# Patient Record
Sex: Female | Born: 1935 | Race: White | Hispanic: No | Smoking: Never smoker
Health system: Southern US, Community
[De-identification: ages and names within clinical notes are randomized; demographics above are authoritative.]

## PROBLEM LIST (undated history)

## (undated) DIAGNOSIS — I10 Essential (primary) hypertension: Secondary | ICD-10-CM

## (undated) DIAGNOSIS — E782 Mixed hyperlipidemia: Secondary | ICD-10-CM

## (undated) HISTORY — DX: Mixed hyperlipidemia: E78.2

## (undated) HISTORY — DX: Essential (primary) hypertension: I10

---

## 2006-08-23 ENCOUNTER — Other Ambulatory Visit: Payer: Self-pay

## 2006-08-24 ENCOUNTER — Inpatient Hospital Stay: Payer: Self-pay | Admitting: Cardiology

## 2006-08-25 ENCOUNTER — Inpatient Hospital Stay (HOSPITAL_COMMUNITY): Admission: AD | Admit: 2006-08-25 | Discharge: 2006-09-06 | Payer: Self-pay | Admitting: Cardiology

## 2006-08-25 ENCOUNTER — Encounter: Payer: Self-pay | Admitting: Internal Medicine

## 2006-08-25 ENCOUNTER — Ambulatory Visit: Payer: Self-pay | Admitting: Internal Medicine

## 2006-09-07 ENCOUNTER — Ambulatory Visit: Payer: Self-pay | Admitting: Internal Medicine

## 2006-09-20 ENCOUNTER — Ambulatory Visit: Payer: Self-pay | Admitting: Cardiology

## 2006-10-12 ENCOUNTER — Ambulatory Visit: Payer: Self-pay | Admitting: Cardiology

## 2006-11-09 ENCOUNTER — Encounter: Payer: Self-pay | Admitting: Cardiology

## 2006-11-09 ENCOUNTER — Ambulatory Visit: Payer: Self-pay | Admitting: Cardiology

## 2006-11-17 ENCOUNTER — Ambulatory Visit: Payer: Self-pay | Admitting: Cardiology

## 2007-01-05 ENCOUNTER — Ambulatory Visit: Payer: Self-pay | Admitting: Cardiology

## 2007-02-09 ENCOUNTER — Ambulatory Visit: Payer: Self-pay | Admitting: Cardiology

## 2007-03-02 ENCOUNTER — Ambulatory Visit: Payer: Self-pay | Admitting: Cardiology

## 2007-08-31 ENCOUNTER — Ambulatory Visit: Payer: Self-pay | Admitting: Cardiology

## 2007-12-07 ENCOUNTER — Ambulatory Visit: Payer: Self-pay | Admitting: Cardiology

## 2008-07-24 ENCOUNTER — Ambulatory Visit: Payer: Self-pay | Admitting: Cardiology

## 2009-04-04 DIAGNOSIS — I10 Essential (primary) hypertension: Secondary | ICD-10-CM | POA: Insufficient documentation

## 2009-04-04 DIAGNOSIS — E785 Hyperlipidemia, unspecified: Secondary | ICD-10-CM | POA: Insufficient documentation

## 2009-05-07 ENCOUNTER — Ambulatory Visit: Payer: Self-pay | Admitting: Cardiology

## 2009-05-07 DIAGNOSIS — I10 Essential (primary) hypertension: Secondary | ICD-10-CM | POA: Insufficient documentation

## 2009-05-07 DIAGNOSIS — I2510 Atherosclerotic heart disease of native coronary artery without angina pectoris: Secondary | ICD-10-CM | POA: Insufficient documentation

## 2009-11-20 ENCOUNTER — Ambulatory Visit: Payer: Self-pay | Admitting: Cardiovascular Disease

## 2009-11-21 ENCOUNTER — Telehealth: Payer: Self-pay | Admitting: Cardiovascular Disease

## 2009-12-13 ENCOUNTER — Telehealth: Payer: Self-pay | Admitting: Cardiovascular Disease

## 2009-12-20 ENCOUNTER — Telehealth: Payer: Self-pay | Admitting: Cardiovascular Disease

## 2010-01-01 ENCOUNTER — Telehealth: Payer: Self-pay | Admitting: Internal Medicine

## 2010-01-02 ENCOUNTER — Encounter: Payer: Self-pay | Admitting: Cardiovascular Disease

## 2010-04-28 ENCOUNTER — Telehealth: Payer: Self-pay | Admitting: Nurse Practitioner

## 2010-06-18 ENCOUNTER — Ambulatory Visit
Admission: RE | Admit: 2010-06-18 | Discharge: 2010-06-18 | Payer: Self-pay | Source: Home / Self Care | Attending: Cardiovascular Disease | Admitting: Cardiovascular Disease

## 2010-06-18 ENCOUNTER — Encounter: Payer: Self-pay | Admitting: Cardiovascular Disease

## 2010-07-08 NOTE — Progress Notes (Signed)
Summary: Cardiology Phone Note - Fatigue & Irregular Heart Rhythm  Phone Note From Other Clinic   Caller: Nurse Summary of Call: received call from [facility], nurse @ [facility] stating that [name] had a 'gi bug' this weekend with diarrhea.  since, she's complaining of wkns and fatigue.  nurse noted tonight that her heart rhythm is irregular in the 80's @ rest and into the low 100's with any activity.  further, her bp is 180's/60's.  she's asking if she should take her to the ED.  I advised that given that she's feeling poorly with new finding of tachycardia and htn on exam, that she should present to [facility] for evaluation this evening. Initial call taken by: Creig Hines, ANP-BC,  April 28, 2010 5:12 PM

## 2010-07-08 NOTE — Assessment & Plan Note (Signed)
Summary: EC6/AMD   Visit Type:  Follow-up Primary Provider:  Dr. Felipa Eth  CC:  edema in ankles.  History of Present Illness: Mrs [name] he is a 77 year old woman with a history of PE, though the details are uncertain, marked reduction in left ventricular function with apical ballooning suggestive of takotsubo, hyperlipidemia, hypertension presenting for routine followup.  She states that overall she has been doing well. she has no new complaints. 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 She walks to meals and that is about it. Her legs are not very strong. She denies any significant chest pain, shortness of breath.   Cardiac Cath  08/25/2006  1. Marked reduction in left ventricular function with apical      ballooning suggestive of takotsubo.  2. Noncritical plaquing of the coronary arteries as described above.   Current Medications (verified): 1)  Metformin Hcl 500 Mg Tabs (Metformin Hcl) .Marland Kitchen.. 1 By Mouth Every Morning 2)  Aspirin 325 Mg Tabs (Aspirin) .Marland Kitchen.. 1 By Mouth Every Morning 3)  Multivitamins  Tabs (Multiple Vitamin) .Marland Kitchen.. 1 By Mouth Every Morning 4)  Oscal 500/200 D-3 500-200 Mg-Unit Tabs (Calcium-Vitamin D) .Marland Kitchen.. 1 By Mouth Every Morning 5)  Nexium 40 Mg Cpdr (Esomeprazole Magnesium) .Marland Kitchen.. 1 By Mouth Every Morning 6)  Lasix 40 Mg Tabs (Furosemide) .Marland Kitchen.. 1 By Mouth Every Morning 7)  K-Lor 20 Meq Pack (Potassium Chloride) .Marland Kitchen.. 1 By Mouth Every Morning 8)  Norvasc 5 Mg Tabs (Amlodipine Besylate) .Marland Kitchen.. 1 By Mouth Every Morning 9)  Enalapril Maleate 20 Mg Tabs (Enalapril Maleate) .Marland Kitchen.. 1 By Mouth Twice Daily 10)  Simvastatin 40 Mg Tabs (Simvastatin) .Marland Kitchen.. 1 By Mouth Once Every Evening 11)  Xalatan 0.005 % Soln (Latanoprost) .Marland Kitchen.. 1 Drop To Each Eye At Bedtime 12)  Azopt 1 % Susp (Brinzolamide) .Marland Kitchen.. 1 Drop To Each Eye Every Morning and Every Evening 13)  Premarin 0.625 Mg/gm Crea (Estrogens, Conjugated) .... Use According To Directions On Monday, Wednesday and Friday 14)  Voltaren  1 % Gel (Diclofenac Sodium) .... Apply To Painful Areas As Needed 15)  Tylenol Arthritis Pain 650 Mg Cr-Tabs (Acetaminophen) .Marland Kitchen.. 1-2 Tablets As Needed For Knee Pain 16)  Alprazolam 0.25 Mg Tabs (Alprazolam) .Marland Kitchen.. 1 Tablet As Needed For Elevated Blood Pressure and Anxiety 17)  Carvedilol 6.25 Mg Tabs (Carvedilol) .Marland Kitchen.. 1 & 1/2 By Mouth Twice Daily 18)  Alprazolam 0.25 Mg Tabs (Alprazolam) .... As Needed 19)  Nitrofurantoin Macrocrystal 50 Mg Caps (Nitrofurantoin Macrocrystal) .... Once Daily  Allergies (verified): No Known Drug Allergies  Past History:  Past Medical History: Last updated: 04/04/2009 HYPERTENSION, UNSPECIFIED (ICD-401.9) HYPERLIPIDEMIA-MIXED (ICD-272.4)    Family History: Last updated: 04/04/2009 Family History of Hypertension:   Social History: Last updated: 04/04/2009 Retired  Widowed  Tobacco Use - No.  Alcohol Use - no  Risk Factors: Smoking Status: never (04/04/2009)  Review of Systems       The patient complains of difficulty walking.  The patient denies fever, weight loss, weight gain, vision loss, decreased hearing, hoarseness, chest pain, syncope, dyspnea on exertion, peripheral edema, prolonged cough, abdominal pain, incontinence, muscle weakness, depression, and enlarged lymph nodes.    Vital Signs:  Patient profile:   77 year old female Height:      64 inches Weight:      141 pounds BMI:     24.29 Pulse rate:   68 / minute BP sitting:   160 / 60  (left arm) Cuff size:   regular  Vitals  Entered By: Bishop Dublin, CMA (November 20, 2009 11:22 AM)  Physical Exam  General:  Well developed, well nourished, in no acute distress. Head:  normocephalic and atraumatic Neck:  Neck supple, no JVD. No masses, thyromegaly or abnormal cervical nodes. Lungs:  Clear bilaterally to auscultation and percussion. Heart:  Non-displaced PMI, chest non-tender; regular rate and rhythm, S1, S2 without murmurs, rubs or gallops. Carotid upstroke normal, no bruit.  Pedals normal pulses. No edema, no varicosities. Abdomen:  Bowel sounds positive; abdomen soft and non-tender without masses Msk:  Back normal, normal gait. Muscle strength and tone normal. Pulses:  pulses normal in all 4 extremities Extremities:  No clubbing or cyanosis. Neurologic:  Alert and oriented x 3. Skin:  Intact without lesions or rashes. Psych:  Normal affect.   New Orders:     1)  EKG w/ Interpretation (93000)   EKG  Procedure date:  11/20/2009  Findings:      normal sinus rhythm with rate 68 beats per minute, T-wave abnormality in V4, V5, V6.  Impression & Recommendations:  Problem # 1:  CAD, NATIVE VESSEL (ICD-414.01) h/o Marked reduction in left ventricular function with apical ballooning suggestive of takotsubo with minimal coronary artery disease. She's been managed on medications including ACE inhibitor, beta blocker.  I have encouraged her to increase her walking not only to just meals.  Her updated medication list for this problem includes:    Aspirin 325 Mg Tabs (Aspirin) .Marland Kitchen... 1 by mouth every morning    Norvasc 5 Mg Tabs (Amlodipine besylate) .Marland Kitchen... 1 by mouth every morning    Enalapril Maleate 20 Mg Tabs (Enalapril maleate) .Marland Kitchen... 1 by mouth twice daily    Carvedilol 6.25 Mg Tabs (Carvedilol) .Marland Kitchen... 1 & 1/2 by mouth twice daily    Amlodipine Besylate 10 Mg Tabs (Amlodipine besylate) .Marland Kitchen... Take one tablet by mouth daily  Orders: EKG w/ Interpretation (93000)  Problem # 2:  HYPERTENSION, BENIGN (ICD-401.1) Her blood pressure is elevated today and we will increase her amlodipine to 10 mg daily.  Her updated medication list for this problem includes:    Aspirin 325 Mg Tabs (Aspirin) .Marland Kitchen... 1 by mouth every morning    Lasix 40 Mg Tabs (Furosemide) .Marland Kitchen... 1 by mouth every morning    Norvasc 5 Mg Tabs (Amlodipine besylate) .Marland Kitchen... 1 by mouth every morning    Enalapril Maleate 20 Mg Tabs (Enalapril maleate) .Marland Kitchen... 1 by mouth twice daily    Carvedilol 6.25 Mg Tabs  (Carvedilol) .Marland Kitchen... 1 & 1/2 by mouth twice daily    Amlodipine Besylate 10 Mg Tabs (Amlodipine besylate) .Marland Kitchen... Take one tablet by mouth daily  Problem # 3:  HYPERLIPIDEMIA-MIXED (ICD-272.4) We will decrease her simvastatin from 40 mg to 20 mg daily given any restrictions. We have suggested we recheck her cholesterol in 2-3 months time.  Her updated medication list for this problem includes:    Simvastatin 20 Mg Tabs (Simvastatin) .Marland Kitchen... Take one tablet by mouth daily at bedtime  Patient Instructions: 1)  Your physician has recommended you make the following change in your medication: Increase amolodipine 10 mg dialy, Decrease simvastatin 20 mg  2)  Your physician wants you to follow-up in:   6 months You will receive a reminder letter in the mail two months in advance. If you don't receive a letter, please call our office to schedule the follow-up appointment. Prescriptions: SIMVASTATIN 20 MG TABS (SIMVASTATIN) Take one tablet by mouth daily at bedtime  #90 x 4   Entered  by:   Benedict Needy, RN   Authorized by:   Dossie Arbour MD   Signed by:   Benedict Needy, RN on 11/20/2009   Method used:   Faxed to ...       [facility] [employer] (mail-order)       [address]       [place], [place]  [address]       Ph: [phone]       Fax: [phone]   RxID:   [id number] AMLODIPINE BESYLATE 10 MG TABS (AMLODIPINE BESYLATE) Take one tablet by mouth daily  #90 x 4   Entered by:   Benedict Needy, RN   Authorized by:   Dossie Arbour MD   Signed by:   Benedict Needy, RN on 11/20/2009   Method used:   Faxed to ...       [facility] (mail-order)       [address]       [place], [place]  [address]       Ph: [phone]       Fax: [phone]   RxID:   [id number]

## 2010-07-08 NOTE — Progress Notes (Signed)
Summary: labwork order  Phone Note Call from Patient Call back at [phone]   Caller: [facility] Call For: Mariah Milling Summary of Call: Caller states pt was told at [facility] yesterday needs lipid/liver labwork done in 1 month.  [facility] needs written order faxed to [phone] to draw there and will fax results.  No documentation in EMR to repeat labwork.  Please advise if you want to draw lipid/liver labwork in 1 month.   Initial call taken by: Cloyde Reams RN,  November 21, 2009 9:43 AM  Follow-up for Phone Call        OK to draw at [facility] in one month.     Appended Document: Orders Update    Clinical Lists Changes  Orders: Added new Test order of T-Hepatic Function [id number]) - Signed Added new Test order of T-Lipid Profile ([id number]) - Signed      Appended Document: labwork order Faxed written order to [facility] for lipid/liver in 1 month with results faxed to [place].  [facility]

## 2010-07-08 NOTE — Progress Notes (Signed)
Summary: refill request for metformin  Phone Note Refill Request Message from:  Fax from Pharmacy  Refills Requested: Medication #1:  METFORMIN HCL 500 MG TABS 1 by mouth every morning   Last Refilled: 11/27/2009 Faxed request from pharmacare is on your desk.  I dont see that pt has had any visits with you.  Initial call taken by: Lowella Petties CMA,  January 01, 2010 4:25 PM  Follow-up for Phone Call        Please check with pharmacare she is not in health care at [facility] and I haven't seen her at least in the past 3+ years Who has given her past Rx? Avigail Pilling Dia Crawford MD  January 01, 2010 7:47 PM   Additional Follow-up for Phone Call Additional follow up Details #1::        Called pharmacare, they will take care of this. Additional Follow-up by: Lowella Petties CMA,  January 02, 2010 9:44 AM

## 2010-07-08 NOTE — Progress Notes (Signed)
Summary: Med Change  Phone Note From Other Clinic   Caller: Nurse Call For: [facility] Summary of Call: [phone] Paula Compton from [facility] called and wanted to know if increasin the Norvasc for patient could cause leg swelling.  Initial call taken by: West Carbo,  December 13, 2009 8:53 AM  Follow-up for Phone Call        [facility] Benedict Needy, RN  December 13, 2009 9:11 AM   Spoke with Paula Compton she said the edema isn't pitting but the pt is uncomfortable. Please advise. Follow-up by: Benedict Needy, RN,  December 13, 2009 9:44 AM  Additional Follow-up for Phone Call Additional follow up Details #1::        faxed orders for amolodipine 5mg  and BP checks x1 week.  Additional Follow-up by: Benedict Needy, RN,  December 13, 2009 5:31 PM    Additional Follow-up for Phone Call Additional follow up Details #2::    will decrease amlodipine to 5 mg and monitor bp  New/Updated Medications: AMLODIPINE BESYLATE 5 MG TABS (AMLODIPINE BESYLATE) Take one tablet by mouth daily Prescriptions: AMLODIPINE BESYLATE 5 MG TABS (AMLODIPINE BESYLATE) Take one tablet by mouth daily  #30 x 6   Entered by:   Benedict Needy, RN   Authorized by:   Dossie Arbour MD   Signed by:   Benedict Needy, RN on 12/13/2009   Method used:   Print then Give to Patient   RxID:   [id number]

## 2010-07-08 NOTE — Progress Notes (Signed)
Summary: BP Week Reading  Phone Note From Other Clinic   Caller: Nurse Call For: [place] Summary of Call: [name] from [facility] called with [name] BP: Monday 130/60, Tues 122/60  [id number]  [id number] friday12/068   If you need to get in touch with [name] her number is [phone] Initial call taken by: West Carbo,  December 20, 2009 2:33 PM  Follow-up for Phone Call        Noted. I will forward to Dr. Mariah Milling for review. Follow-up by: Sherri Rad, RN, BSN,  December 20, 2009 5:08 PM    Additional Follow-up for Phone Call Additional follow up Details #2::    BP looks great

## 2010-07-10 NOTE — Assessment & Plan Note (Signed)
Summary: F6M/AMD   Visit Type:  Follow-up Primary Provider:  Dr. Felipa Eth  CC:  "doing well". denies SOB and chest pain and palpitations..  History of Present Illness: [name] he is a 77 year old woman with a history of PE, though the details are uncertain, marked reduction in left ventricular function with apical ballooning suggestive of takotsubo, hyperlipidemia, hypertension presenting for routine followup.  She states that overall she has been doing well. she has no new complaints. 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 She walks to meals and that is about it. Her legs are not very strong. she uses a walker. She denies any significant chest pain, shortness of breath.  she reports an episode over one month ago of hypertension with systolic pressures in the 160s. She has a blood pressure check once a week at [facility]. Her pressures range from 110-120 systolic. She's had no symptoms of lightheadedness or dizziness.  she does have severe arthritis pain in her toes and feet which limits her ability to walk.   Cardiac Cath  08/25/2006  1. Marked reduction in left ventricular function with apical      ballooning suggestive of takotsubo.  2. Noncritical plaquing of the coronary arteries as described above.  EKG shows normal sinus rhythm with rate of 63 beats per minute, rare APC, right bundle branch block   Current Medications (verified): 1)  Metformin Hcl 500 Mg Tabs (Metformin Hcl) .Marland Kitchen.. 1 By Mouth Every Morning 2)  Aspirin 325 Mg Tabs (Aspirin) .Marland Kitchen.. 1 By Mouth Every Morning 3)  Multivitamins  Tabs (Multiple Vitamin) .Marland Kitchen.. 1 By Mouth Every Morning 4)  Oscal 500/200 D-3 500-200 Mg-Unit Tabs (Calcium-Vitamin D) .Marland Kitchen.. 1 By Mouth Every Morning 5)  Nexium 40 Mg Cpdr (Esomeprazole Magnesium) .Marland Kitchen.. 1 By Mouth Every Morning 6)  Lasix 40 Mg Tabs (Furosemide) .Marland Kitchen.. 1 By Mouth Every Morning 7)  K-Lor 20 Meq Pack (Potassium Chloride) .Marland Kitchen.. 1 By Mouth Every Morning 8)  Norvasc 5 Mg Tabs  (Amlodipine Besylate) .Marland Kitchen.. 1 By Mouth Every Morning 9)  Enalapril Maleate 20 Mg Tabs (Enalapril Maleate) .Marland Kitchen.. 1 By Mouth Twice Daily 10)  Simvastatin 20 Mg Tabs (Simvastatin) .... Take One Tablet By Mouth Daily At Bedtime 11)  Xalatan 0.005 % Soln (Latanoprost) .Marland Kitchen.. 1 Drop To Each Eye At Bedtime 12)  Azopt 1 % Susp (Brinzolamide) .Marland Kitchen.. 1 Drop To Each Eye Every Morning and Every Evening 13)  Premarin 0.625 Mg/gm Crea (Estrogens, Conjugated) .... Use According To Directions On Monday, Wednesday and Friday 14)  Voltaren 1 % Gel (Diclofenac Sodium) .... Apply To Painful Areas As Needed 15)  Tylenol Arthritis Pain 650 Mg Cr-Tabs (Acetaminophen) .Marland Kitchen.. 1-2 Tablets As Needed For Knee Pain 16)  Alprazolam 0.25 Mg Tabs (Alprazolam) .Marland Kitchen.. 1 Tablet As Needed For Elevated Blood Pressure and Anxiety 17)  Carvedilol 6.25 Mg Tabs (Carvedilol) .Marland Kitchen.. 1 & 1/2 By Mouth Twice Daily 18)  Nitrofurantoin Macrocrystal 50 Mg Caps (Nitrofurantoin Macrocrystal) .... Once Daily 19)  Ultram 50 Mg Tabs (Tramadol Hcl) .... Take Every 8 Hours As Needed For Pain  Allergies (verified): No Known Drug Allergies  Past History:  Past Medical History: Last updated: 04/04/2009 HYPERTENSION, UNSPECIFIED (ICD-401.9) HYPERLIPIDEMIA-MIXED (ICD-272.4)    Family History: Last updated: 04/04/2009 Family History of Hypertension:   Social History: Last updated: 04/04/2009 Retired  Widowed  Tobacco Use - No.  Alcohol Use - no  Risk Factors: Smoking Status: never (04/04/2009)  Review of Systems       The  patient complains of difficulty walking.  The patient denies fever, weight loss, weight gain, vision loss, decreased hearing, hoarseness, chest pain, syncope, dyspnea on exertion, peripheral edema, prolonged cough, abdominal pain, incontinence, muscle weakness, depression, and enlarged lymph nodes.         rare ankle swelling, severe arthritis pain  Vital Signs:  Patient profile:   77 year old female Height:      64  inches Weight:      129 pounds BMI:     22.22 Pulse rate:   62 / minute BP sitting:   160 / 58  (left arm) Cuff size:   regular  Vitals Entered By: Lysbeth Galas CMA (June 18, 2010 11:09 AM)  Physical Exam  General:  Well developed, well nourished, in no acute discomfort,   elderly woman walking with a walker, significant difficulty transferring from table to chair. Head:  normocephalic and atraumatic Neck:  Neck supple, no JVD. No masses, thyromegaly or abnormal cervical nodes. Lungs:  Clear bilaterally to auscultation and percussion. Heart:  Non-displaced PMI, chest non-tender; regular rate and rhythm, S1, S2 without murmurs, rubs or gallops. Carotid upstroke normal, no bruit. Pedals normal pulses. No edema, no varicosities. Abdomen:  Bowel sounds positive; abdomen soft and non-tender without masses Msk:  Back normal, normal gait. Muscle strength and tone normal. Pulses:  pulses normal in all 4 extremities Extremities:  No clubbing or cyanosis. Neurologic:  Alert and oriented x 3. Skin:  Intact without lesions or rashes. Psych:  Normal affect.   Impression & Recommendations:  Problem # 1:  HYPERTENSION, BENIGN (ICD-401.1) blood pressure over the past month has been well controlled by her numbers. We've made no medication changes.  The following medications were removed from the medication list:    Amlodipine Besylate 5 Mg Tabs (Amlodipine besylate) .Marland Kitchen... Take one tablet by mouth daily Her updated medication list for this problem includes:    Aspirin 81 Mg Tbec (Aspirin) .Marland Kitchen... Take one tablet by mouth daily    Lasix 40 Mg Tabs (Furosemide) .Marland Kitchen... 1 by mouth every morning    Norvasc 5 Mg Tabs (Amlodipine besylate) .Marland Kitchen... 1 by mouth every morning    Enalapril Maleate 20 Mg Tabs (Enalapril maleate) .Marland Kitchen... 1 by mouth twice daily    Carvedilol 6.25 Mg Tabs (Carvedilol) .Marland Kitchen... 1 & 1/2 by mouth twice daily  Problem # 2:  CAD, NATIVE VESSEL (ICD-414.01) Minimal coronary artery disease  by remote cardiac catheterization. We will decrease her aspirin to 81 mg x2 from 325 mg daily.  The following medications were removed from the medication list:    Amlodipine Besylate 5 Mg Tabs (Amlodipine besylate) .Marland Kitchen... Take one tablet by mouth daily Her updated medication list for this problem includes:    Aspirin 81 Mg Tbec (Aspirin) .Marland Kitchen... Take one tablet by mouth daily    Norvasc 5 Mg Tabs (Amlodipine besylate) .Marland Kitchen... 1 by mouth every morning    Enalapril Maleate 20 Mg Tabs (Enalapril maleate) .Marland Kitchen... 1 by mouth twice daily    Carvedilol 6.25 Mg Tabs (Carvedilol) .Marland Kitchen... 1 & 1/2 by mouth twice daily  Orders: EKG w/ Interpretation (93000)  Problem # 3:  HYPERLIPIDEMIA-MIXED (ICD-272.4) Continue her simvastatin.  Her updated medication list for this problem includes:    Simvastatin 20 Mg Tabs (Simvastatin) .Marland Kitchen... Take one tablet by mouth daily at bedtime  Patient Instructions: 1)  Your physician recommends that you schedule a follow-up appointment in: 6 months 2)  Your physician has recommended you make the following change  in your medication: DECREASE Aspirin to 81mg  2 tablets daily.

## 2010-09-26 ENCOUNTER — Emergency Department (HOSPITAL_COMMUNITY): Payer: Medicare Other

## 2010-09-26 ENCOUNTER — Inpatient Hospital Stay (HOSPITAL_COMMUNITY)
Admission: EM | Admit: 2010-09-26 | Discharge: 2010-10-01 | DRG: 243 | Disposition: A | Discharge status: Skilled Nursing Facility | Payer: Medicare Other | Attending: Cardiology | Admitting: Cardiology

## 2010-09-26 DIAGNOSIS — Y998 Other external cause status: Secondary | ICD-10-CM

## 2010-09-26 DIAGNOSIS — E119 Type 2 diabetes mellitus without complications: Secondary | ICD-10-CM | POA: Diagnosis present

## 2010-09-26 DIAGNOSIS — Z86711 Personal history of pulmonary embolism: Secondary | ICD-10-CM

## 2010-09-26 DIAGNOSIS — I442 Atrioventricular block, complete: Principal | ICD-10-CM | POA: Diagnosis present

## 2010-09-26 DIAGNOSIS — R55 Syncope and collapse: Secondary | ICD-10-CM

## 2010-09-26 DIAGNOSIS — I4510 Unspecified right bundle-branch block: Secondary | ICD-10-CM | POA: Diagnosis present

## 2010-09-26 DIAGNOSIS — I5181 Takotsubo syndrome: Secondary | ICD-10-CM | POA: Diagnosis present

## 2010-09-26 DIAGNOSIS — R296 Repeated falls: Secondary | ICD-10-CM | POA: Diagnosis present

## 2010-09-26 DIAGNOSIS — I252 Old myocardial infarction: Secondary | ICD-10-CM

## 2010-09-26 DIAGNOSIS — M1990 Unspecified osteoarthritis, unspecified site: Secondary | ICD-10-CM | POA: Diagnosis present

## 2010-09-26 LAB — GLUCOSE, CAPILLARY: Glucose-Capillary: 102 mg/dL — ABNORMAL HIGH (ref 70–99)

## 2010-09-26 LAB — POCT CARDIAC MARKERS
Myoglobin, poc: 92.1 ng/mL (ref 12–200)
Troponin i, poc: <0.05 ng/mL (ref 0.00–0.09)

## 2010-09-26 LAB — CK TOTAL AND CKMB (NOT AT ARMC): CK, MB: 2.2 ng/mL (ref 0.3–4.0)

## 2010-09-26 LAB — DIFFERENTIAL
Basophils Absolute: 0 10*3/uL (ref 0.0–0.1)
Basophils Relative: 0 % (ref 0–1)
Eosinophils Absolute: 0.2 10*3/uL (ref 0.0–0.7)
Monocytes Relative: 6 % (ref 3–12)
Neutro Abs: 5.7 10*3/uL (ref 1.7–7.7)
Neutrophils Relative %: 64 % (ref 43–77)

## 2010-09-26 LAB — BASIC METABOLIC PANEL
CO2: 25 mEq/L (ref 19–32)
GFR calc non Af Amer: >60 mL/min (ref >60)
Glucose, Bld: 112 mg/dL — ABNORMAL HIGH (ref 70–99)
Potassium: 4 mEq/L (ref 3.5–5.1)

## 2010-09-26 LAB — URINALYSIS, ROUTINE W REFLEX MICROSCOPIC
Bilirubin Urine: NEGATIVE
Ketones, ur: NEGATIVE mg/dL
Nitrite: NEGATIVE
Urobilinogen, UA: 0.2 mg/dL (ref 0.0–1.0)
pH: 6.5 (ref 5.0–8.0)

## 2010-09-26 LAB — BRAIN NATRIURETIC PEPTIDE: Pro B Natriuretic peptide (BNP): 258 pg/mL — ABNORMAL HIGH (ref 0.0–100.0)

## 2010-09-26 LAB — CBC
MCH: 30.3 pg (ref 26.0–34.0)
Platelets: 205 10*3/uL (ref 150–400)
RBC: 4 MIL/uL (ref 3.87–5.11)
WBC: 9 10*3/uL (ref 4.0–10.5)

## 2010-09-26 LAB — TROPONIN I: Troponin I: 0.03 ng/mL (ref 0.00–0.06)

## 2010-09-26 LAB — URINE MICROSCOPIC-ADD ON

## 2010-09-26 LAB — TSH: TSH: 0.737 u[IU]/mL (ref 0.350–4.500)

## 2010-09-27 LAB — GLUCOSE, CAPILLARY
Glucose-Capillary: 94 mg/dL (ref 70–99)
Glucose-Capillary: 123 mg/dL — ABNORMAL HIGH (ref 70–99)

## 2010-09-28 DIAGNOSIS — I379 Nonrheumatic pulmonary valve disorder, unspecified: Secondary | ICD-10-CM

## 2010-09-28 LAB — GLUCOSE, CAPILLARY
Glucose-Capillary: 123 mg/dL — ABNORMAL HIGH (ref 70–99)
Glucose-Capillary: 111 mg/dL — ABNORMAL HIGH (ref 70–99)

## 2010-09-29 DIAGNOSIS — I442 Atrioventricular block, complete: Secondary | ICD-10-CM

## 2010-09-29 LAB — PROTIME-INR
INR: 1.05 (ref 0.00–1.49)
Prothrombin Time: 13.9 seconds (ref 11.6–15.2)

## 2010-09-29 LAB — GLUCOSE, CAPILLARY
Glucose-Capillary: 119 mg/dL — ABNORMAL HIGH (ref 70–99)
Glucose-Capillary: 121 mg/dL — ABNORMAL HIGH (ref 70–99)

## 2010-09-29 LAB — MRSA PCR SCREENING: MRSA by PCR: NEGATIVE

## 2010-09-29 LAB — APTT: aPTT: 25 seconds (ref 24–37)

## 2010-09-30 ENCOUNTER — Inpatient Hospital Stay (HOSPITAL_COMMUNITY): Payer: Medicare Other

## 2010-09-30 LAB — GLUCOSE, CAPILLARY: Glucose-Capillary: 107 mg/dL — ABNORMAL HIGH (ref 70–99)

## 2010-10-01 ENCOUNTER — Telehealth: Payer: Self-pay | Admitting: Internal Medicine

## 2010-10-01 LAB — GLUCOSE, CAPILLARY: Glucose-Capillary: 90 mg/dL (ref 70–99)

## 2010-10-01 NOTE — Consult Note (Signed)
NAMETIEGAN, JAMBOR                ACCOUNT NO.:  [id number]  MEDICAL RECORD NO.:  [id number]           PATIENT TYPE:  I  LOCATION:  2917                         FACILITY:  [facility]  PHYSICIAN:  Duke Salvia, MD, FACCDATE OF BIRTH:  28-Feb-1914  DATE OF CONSULTATION:  09/29/2010 DATE OF DISCHARGE:                                CONSULTATION   Thank you very much for asking [place] to see Mrs. Yott in consultation for intermittent complete heart block.  She presents to the hospital with a history of recurrent recent onset syncope.  She had been having symptoms for number of days and she had undergone evaluation by her primary care physician including head CT, which was normal.  When she go to the hospital, her initial rhythms were normal.  She then had while being monitored recurrent episodes of prolonged pausing with complete heart block.  There was some variability of the P-wave.  These created symptoms similar to her presyncopal episodes.  She does have underlying right bundle-branch block and first-degree AV block.  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  She has a history of pulmonary embolism, hypertension, hyperlipidemia, non-insulin dependent diabetes.  She also has a history of PAF.  She is not on anticoagulation for the PAF and the pulmonary embolism.  PAST MEDICAL HISTORY:  In addition to the above is notable for some osteoarthritis and recurrent UTIs and dyslipidemia.  PAST SURGICAL HISTORY:  Notable for nephrolithiasis.  REVIEW OF SYSTEMS:  In addition to the above is notable for some difficulty hearing, using hearing aids, some glaucoma, and mild dementia and osteopenia.  SOCIAL HISTORY:  She is widowed.  She has 1 child, surviving.  She has 1 child who is deceased.  She lives at [facility].  She is a retired Catering manager.  She does not use alcohol  or recreational drugs.  OUTPATIENT MEDICATION:  Included, metformin, aspirin, Lasix, Norvasc, enalapril, carvedilol, simvastatin, tramadol, diclofenac, Premarin, and Xanax.  INPATIENT MEDICATIONS:  Notable for the intercurrent discontinuation of her beta-blocker.  She had been maintained on amlodipine.  ALLERGIES:  She has no known drug allergies.  PHYSICAL EXAMINATION:  GENERAL:  She is an elderly Caucasian female, appearing in 72 and good deal younger than her stated age of 46. VITAL SIGNS:  Her blood pressure was 166/67.  Her heart rate was 43 and irregular. HEENT:  Normal. NECK:  Her neck veins were flat.  Her carotids are brisk and full bilaterally without bruits. LUNGS:  Clear. HEART:  Sounds were irregular.  There are no murmurs or gallops. ABDOMEN:  Soft with active bowel sounds without midline pulsation or hepatomegaly. EXTREMITIES:  Femoral pulses were 2+.  Distal pulses were intact.  There is no clubbing, cyanosis or edema. NEUROLOGIC:  Grossly normal.  Electrocardiogram dated April 20 demonstrated sinus rhythm at 60 with intervals of 0.20/0.13/0.45 with a right bundle-branch block.  Telemetry strips overnight showed complete heart block with pauses of greater than 5 seconds with multiple not conducted P-waves.  IMPRESSION: 1. High-grade heart block. 2.  Syncope secondary to #1. 3. Status post myocardial infarction with takotsubo pathology with     intercurrent normalization of LV function. 4. History of paroxysmal atrial fibrillation. 5. Diabetes. 6. Hypertension for a CHADS-VASC score of 5+. 7. History of pulmonary embolism.  Mrs. Stoffers has high-grade heart block with recurrent syncope.  Pacing is indicated.  Norvasc is a potential contributor as was a carvedilol, but that block is still high-grade, I think the likelihood that would improve, notwithstanding the discontinuation of the carvedilol, which was already done for 48 hours with the amlodipine is very  small.  I have reviewed with the family potential risks and benefits including but not limited death, perforation, infection, and lead dislodgement.  They understand these risks and willing to proceed.  Thank you for the consultation.     Duke Salvia, MD, [facility]     SCK/MEDQ  D:  09/29/2010  T:  09/30/2010  Job:  [id number]  Electronically Signed by Sherryl Manges MD [facility] on 10/01/2010 08:53:07 AM

## 2010-10-01 NOTE — Op Note (Signed)
  [name], [name]                ACCOUNT NO.:  [id number]  MEDICAL RECORD NO.:  [id number]           PATIENT TYPE:  I  LOCATION:  2917                         FACILITY:  [facility]  PHYSICIAN:  Duke Salvia, MD, FACCDATE OF BIRTH:  06-Nov-1913  DATE OF PROCEDURE:  09/29/2010 DATE OF DISCHARGE:                              OPERATIVE REPORT   PREOPERATIVE DIAGNOSIS:  Complete heart block.  POSTOPERATIVE DIAGNOSIS:  Complete heart block.  PROCEDURE:  Dual-chamber pacemaker implantation.  Following obtaining informed consent, the patient was brought to the electrophysiology laboratory and placed on the fluoroscopic table in supine position.  After routine prep and drape of the left upper chest, lidocaine was infiltrated in the prepectoral subclavicular region. Incision was made and carried down to layer of prepectoral fascia using electrocautery and sharp dissection.  A pocket was formed similarly. Hemostasis was obtained.  Thereafter attention was turned gain access to extrathoracic left subclavian vein, which was accomplished with modest difficulty, but without the aspiration of air or puncture of the artery.  Two separate venipunctures were accomplished.  Guidewires were placed and retained and sequentially 7-French sheath were placed, which were passed a [employer] Jude Isoflex 1948 52 cm passive fixation ventricular lead serial number [id number] and an Isoflex 1944 passive fixation atrial lead, serial number [id number].  Under fluoroscopic guidance, these were manipulated to the right ventricular apex and the right atrial appendage respectively where bipolar R-wave was 7.2 with pace impedance of 651 a threshold 0.4 at 0.4 and current at threshold was 0.6 MA.  The bipolar P-wave was 3.8 with pace impedance of 400, threshold was 0.6 at 0.4.  Current at threshold was 1.4 MA.  These leads were secured to the prepectoral fascia.  The ventricular lead was marked with a tie. The leads were  attached to [employer] DR pulse generator model PM 2110, serial number [id number].  Ventricular pacing and P synchronous pacing were identified.  Leads and pulse generator were placed in the pocket, secured to the prepectoral fascia, and the wound was closed in 3 layers in normal fashion.  It was washed and dried and Benzoin and Steri-Strip dressing was applied.  Needle counts, sponge counts, and instrument counts were correct at the end of procedure according to the staff.  The patient tolerated the procedure without apparent complications.  The patient was device dependent at the end of the procedure as anticipated.     Duke Salvia, MD, [facility]     SCK/MEDQ  D:  09/29/2010  T:  09/30/2010  Job:  [id number]  Electronically Signed by Sherryl Manges MD [facility] on 10/01/2010 08:53:04 AM

## 2010-10-02 ENCOUNTER — Ambulatory Visit: Payer: Self-pay | Admitting: Cardiovascular Disease

## 2010-10-04 ENCOUNTER — Emergency Department (HOSPITAL_COMMUNITY): Payer: Medicare Other

## 2010-10-04 ENCOUNTER — Emergency Department (HOSPITAL_COMMUNITY)
Admission: EM | Admit: 2010-10-04 | Discharge: 2010-10-04 | Disposition: A | Discharge status: Home / Self Care | Payer: Medicare Other | Attending: Emergency Medicine | Admitting: Emergency Medicine

## 2010-10-04 DIAGNOSIS — I509 Heart failure, unspecified: Secondary | ICD-10-CM | POA: Insufficient documentation

## 2010-10-04 DIAGNOSIS — I10 Essential (primary) hypertension: Secondary | ICD-10-CM | POA: Insufficient documentation

## 2010-10-04 DIAGNOSIS — M810 Age-related osteoporosis without current pathological fracture: Secondary | ICD-10-CM | POA: Insufficient documentation

## 2010-10-04 DIAGNOSIS — I4891 Unspecified atrial fibrillation: Secondary | ICD-10-CM | POA: Insufficient documentation

## 2010-10-04 DIAGNOSIS — E785 Hyperlipidemia, unspecified: Secondary | ICD-10-CM | POA: Insufficient documentation

## 2010-10-04 DIAGNOSIS — I252 Old myocardial infarction: Secondary | ICD-10-CM | POA: Insufficient documentation

## 2010-10-04 DIAGNOSIS — E119 Type 2 diabetes mellitus without complications: Secondary | ICD-10-CM | POA: Insufficient documentation

## 2010-10-04 DIAGNOSIS — R42 Dizziness and giddiness: Secondary | ICD-10-CM | POA: Insufficient documentation

## 2010-10-04 DIAGNOSIS — K219 Gastro-esophageal reflux disease without esophagitis: Secondary | ICD-10-CM | POA: Insufficient documentation

## 2010-10-04 DIAGNOSIS — M129 Arthropathy, unspecified: Secondary | ICD-10-CM | POA: Insufficient documentation

## 2010-10-04 DIAGNOSIS — Z79899 Other long term (current) drug therapy: Secondary | ICD-10-CM | POA: Insufficient documentation

## 2010-10-04 LAB — COMPREHENSIVE METABOLIC PANEL
AST: 17 U/L (ref 0–37)
Albumin: 3.3 g/dL — ABNORMAL LOW (ref 3.5–5.2)
BUN: 24 mg/dL — ABNORMAL HIGH (ref 6–23)
Calcium: 8.7 mg/dL (ref 8.4–10.5)
Chloride: 107 mEq/L (ref 96–112)
Creatinine, Ser: 0.65 mg/dL (ref 0.4–1.2)
GFR calc Af Amer: >60 mL/min (ref >60)
Total Bilirubin: 0.3 mg/dL (ref 0.3–1.2)
Total Protein: 6.6 g/dL (ref 6.0–8.3)

## 2010-10-04 LAB — DIFFERENTIAL
Basophils Relative: 0 % (ref 0–1)
Eosinophils Absolute: 0.3 10*3/uL (ref 0.0–0.7)
Lymphs Abs: 1.9 10*3/uL (ref 0.7–4.0)
Monocytes Absolute: 0.7 10*3/uL (ref 0.1–1.0)
Monocytes Relative: 7 % (ref 3–12)

## 2010-10-04 LAB — URINALYSIS, ROUTINE W REFLEX MICROSCOPIC
Glucose, UA: NEGATIVE mg/dL
Ketones, ur: NEGATIVE mg/dL
Nitrite: NEGATIVE
Specific Gravity, Urine: 1.013 (ref 1.005–1.030)
pH: 5 (ref 5.0–8.0)

## 2010-10-04 LAB — CBC
Hemoglobin: 11.6 g/dL — ABNORMAL LOW (ref 12.0–15.0)
MCH: 31.2 pg (ref 26.0–34.0)
MCHC: 34.1 g/dL (ref 30.0–36.0)
MCV: 91.4 fL (ref 78.0–100.0)
Platelets: 178 10*3/uL (ref 150–400)
RBC: 3.72 MIL/uL — ABNORMAL LOW (ref 3.87–5.11)

## 2010-10-04 LAB — URINE MICROSCOPIC-ADD ON

## 2010-10-04 LAB — POCT CARDIAC MARKERS: Troponin i, poc: <0.05 ng/mL (ref 0.00–0.09)

## 2010-10-06 NOTE — H&P (Signed)
[name]                ACCOUNT NO.:  [id number]  MEDICAL RECORD NO.:  [id number]           PATIENT TYPE:  I  LOCATION:  3733                         FACILITY:  [facility]  PHYSICIAN:  Kathleen Frieze. Jens Som, MD, FACCDATE OF BIRTH:  06-21-1913  DATE OF ADMISSION:  09/26/2010 DATE OF DISCHARGE:                             HISTORY & PHYSICAL   PRIMARY CARDIOLOGIST:  Antonieta Iba, MD  PRIMARY CARE PHYSICIAN:  Larina Earthly, MD  CHIEF COMPLAINT:  Presyncope.  HISTORY OF PRESENT ILLNESS:  [name] is a 77 year old Caucasian female with a known history of acute wall infarct secondary to stress (takotsubo cardiomyopathy) per cath on August 25, 2006, with total recovery and a normal ejection fraction on followup echocardiogram in April 2008 with no regional wall motion abnormalities and only grade 1 diastolic dysfunction.  The patient also has history significant for pulmonary emboli, hypertension, hyperlipidemia, NIDDM, and postinfarction atrial fibrillation who now presents after severe presyncope today preceded by two syncopal events earlier in the week.  The patient was in her usual state of health until early in the week while she was walking back from having dinner at her senior living facility, she had syncope without warning.  The patient says she had been walking for a few minutes and did not have chest pain or shortness of breath or any type of warning and simply awoke on the floor in a different physician that she felt made sense based on the direction she thought she was having.  She thinks she was out for only a couple of seconds.  There was no tongue biting.  There was no loss of bowel or bladder.  She had fallen and hurt her knees, but otherwise no trauma. The patient was concerned about symptoms, but did not seek further evaluation.  At that time, unfortunately had another event where she had just use the restroom and had stood up to get her clothes arranged  when she had another event without warning.  This time, she hit her left face/head, although with no clear bleeding or severe trauma concerned at that time, very similar to prior episode in terms of character.  She saw her primary care physician, Dr. Felipa Eth who completed a CT of the head, which I have confirmed with emergency department midlevel was normal per discussion with Dr. Felipa Eth.  Today, she had use the restroom having had a bowel movement about 15 minutes prior to her event.  She was just sitting and talking and has been doing so for approximately 15 minutes when she had severe presyncope, lasted 10-15 minutes, but she did not lose consciousness.  At no time during these syncopal events or this presyncope did she have palpitations.  She has not had any other complaints recently like fevers, chills, nausea, vomiting, diaphoresis, change to chronic dyspnea on exertion, cough, wheezing, bleeding, dark stools or any other changes.  The nurse who was speaking to during this last events became concerned and contacted the son who then decided that she be brought to [facility] as he lives in [place].  Here in the emergency department, she has  no acute changes on her EKG, but it did show a right bundle- branch block with borderline first-degree AV block.  Careful review of telemetry does not show any significant pauses and only shows sinus rhythm with frequent premature atrial complexes.  Chest x-ray shows cardiomegaly without CHF.  Vital signs notable only for hypertension with peak systolic at 183 generally running in the 150s-180s.  O2 saturation is 93% on room air.  CBC and BMET are unremarkable, urinalysis consistent with urinary tract infection.  Currently, the patient feels pretty much back to her baseline.  PAST MEDICAL HISTORY: 1. Stress-induced acute anterior wall infarction with full recovery     (takotsubo cardiomyopathy).     a.     Nonobstructive CAD with apical  ballooning suggestive of      takotsubo, August 25, 2006. 2. History of pulmonary embolism. 3. Hypertension. 4. Hyperlipidemia. 5. Non-insulin-dependent diabetes mellitus. 6. Osteoarthritis. 7. Postinfarct atrial fibrillation. 8. Recurrent urinary tract infections.  SOCIAL HISTORY:  The patient is widowed and retired.  She lives at [facility] [employer] [place].  She has no history of smoking, negative for significant EtOH.  No history of illicit drugs. Regular diet.  No regular exercise, but is active and independent with most daily activities, although she does use a walker.  FAMILY HISTORY:  Noncontributory secondary to the patient advanced age.  REVIEW OF SYSTEMS:  Please see HPI.  All other systems were reviewed and were negative.  CODE STATUS:  DNR.  ALLERGIES:  NKDA.  MEDICATIONS: 1. Metformin 500 mg one tablet p.o. daily. 2. Aspirin 325 mg one half tablet p.o. daily. 3. Multivitamin one tablet daily. 4. Os-Cal with vitamin D 500 mg one tablet daily. 5. Nexium 40 mg one tablet daily. 6. Lasix 40 mg one tablet daily. 7. Potassium chloride 20 mEq p.o. daily. 8. Norvasc 5 mg p.o. daily. 9. Enalapril 20 mg p.o. b.i.d. 10.Carvedilol 9.75 mg p.o. b.i.d. 11.Simvastatin 20 mg p.o. daily. 12.PreserVision eye vitamins and take one capsule b.i.d. 13.Nitrofurantoin 50 mg one tablet daily. 14.Tramadol 50 mg q.8 h. p.r.n. 15.Azopt one drop each eye nightly. 16.Xalatan one drop both eyes nightly. 17.Estrace 0.1 mg/g vaginal cream three times per week. 18.Premarin cream use according to directions, Monday, Wednesday, and     Friday. 19.Diclofenac sodium gel 1% p.r.n. for painful areas. 20.Arthritis Strength Tylenol one to two tablets p.r.n. 21.Xanax 0.25 mg p.r.n. for hypertension and anxiety.  PHYSICAL EXAMINATION:  VITAL SIGNS:  Temperature 98.1 degrees Fahrenheit, blood pressure 150-183/53-74, pulse 65-86, respiration rate 16-23, O2 saturation 99% on room  air. GENERAL:  The patient is alert and oriented x3 in no apparent stress, able to speak easily and move without respiratory distress. HEENT:  Head is normocephalic, but shows ecchymosis with no evidence of bleeding on the left forehead/scalp.  Pupils are equal, round, reactive to light.  Extraocular muscles are intact.  Nares are patent without discharge.  Dentition is fair.  This lady has no teeth.  Oropharynx without erythema or exudate. NECK:  Supple without lymphadenopathy.  No thyromegaly or JVD elevated, question JVP approximately 12 cm. LUNGS:  Clear to auscultation throughout. HEART:  Rate is regular with audible S1-S2.  No clicks, rubs, murmurs, or gallops.  Pulses are 2+ and equal in both upper and lower extremities bilaterally. SKIN:  No rashes, lesions, or petechiae. ABDOMEN:  Soft, nontender, nondistended.  Normal abdominal bowel sounds. No rebound, guarding, or hepatosplenomegaly. EXTREMITIES:  No clubbing, cyanosis, or edema. MUSCULOSKELETAL:  Without joint  deformity or effusions.  No spinal or CVA tenderness. NEUROLOGIC:  Alert and oriented x3.  Cranial nerves II through XII grossly intact (cranial nerve VIII diminish).  Strengths are 5/5 in all extremities and axial groups, normal sensation throughout and normal cerebellar function (assessed on hospital bed only).  RADIOLOGY:  Chest x-ray shows cardiomegaly without failure.  EKG:  Normal sinus rhythm with a right bundle-branch block and borderline first-degree AV block with biphasic T-waves in V4 through V6 as well as lead III and aVF.  No significant Q-waves, rate 60 bpm, PR 200, QRS 134, QTc 456.  WBC is 9.0, HGB 12.1, HCT 36.3, PLT count is 205.  Sodium 140, potassium 4.0, chloride 107, bicarb 25, BUN 22, creatinine 0.68, glucose 112.  Point-of-care markers negative x1. Urinalysis consistent with urinary tract infection.  ASSESSMENT AND PLAN:  Ms. Eveland is a 78 year old Caucasian female with the above-noted  complex medical history including takotsubo cardiomyopathy/acute anterior wall myocardial infarction secondary to stress, hypertension, non-insulin-dependent diabetes mellitus, and pulmonary embolism as well as postinfarction atrial fibrillation, now presents after two syncopal episodes and presyncope today.  The patient had sudden onset of syncope without warning.  Does not appear to be related to position, no palpitations, no shortness of breath, no nausea, no chest pain.  Duration approximately 2 seconds. Second episode was 2 days later after bowel movement with trauma to the head and left upper extremity.  Today, the patient had significant dizziness for about 15 minutes while sitting.  EKG shows sinus bradycardia with first-degree atrioventricular block and right bundle- branch block.  Syncope - etiology is unclear, question bradycardia mediated given age and conduction disease and EKG.  We will admit to telemetry and check a 2-D echocardiogram.  We will discontinue Coreg and Lasix at this time with possible orthostatic contribution.  We will increase Norvasc for blood pressure control.  The patient may need outpatient CardioNet.  Note, the patient with loss of consciousness with takotsubo event in 2008, but asymptomatic for extended time.  Doubt ischemia, but we will cycle cardiac enzymes.   Jarrett Ables, PAC   ______________________________ Kathleen Frieze. Jens Som, MD, [facility]    MS/MEDQ  D:  09/26/2010  T:  09/27/2010  Job:  [id number]  Electronically Signed by Jarrett Ables PAC on 10/03/2010 02:34:13 PM Electronically Signed by Olga Millers MD [facility] on 10/06/2010 07:25:10 PM

## 2010-10-07 LAB — URINE CULTURE
Colony Count: 85000
Culture  Setup Time: 201204282045

## 2010-10-08 ENCOUNTER — Ambulatory Visit: Payer: Medicare Other | Admitting: *Deleted

## 2010-10-08 ENCOUNTER — Ambulatory Visit (INDEPENDENT_AMBULATORY_CARE_PROVIDER_SITE_OTHER): Payer: Medicare Other | Admitting: *Deleted

## 2010-10-08 DIAGNOSIS — I442 Atrioventricular block, complete: Secondary | ICD-10-CM

## 2010-10-21 NOTE — Assessment & Plan Note (Signed)
[facility]                                 ON-CALL NOTE   AVRIEL, KANDEL                         MRN:          [id number]  DATE:01/04/2007                            DOB:          10-09-1913    Tonight I received a call from Mr. Nusaybah Ivie, regarding his mother,  [name].  Apparently, [name], who has  coronary artery disease and is status post a previous myocardial  infarction.  She is currently residing in [place].  This morning, she  was noted to have a blood pressure of 160/80.  He called back tonight to  check on her and apparently her blood pressure was 190/98.  She was  given a Tylenol.  They called back a half hour later, at his request,  and her blood pressure was 180/90 and she still had not received any  further antihypertensives.  He thus called me, asking for suggestions.   I called [facility] and left a message on the resident administrator's  number to get back in touch with me and also call the doctor on call for  the facility to help treat her blood pressure.     Bevelyn Buckles. Bensimhon, MD  Electronically Signed    DRB/MedQ  DD: 01/04/2007  DT: 01/05/2007  Job #: [id number]

## 2010-10-21 NOTE — Assessment & Plan Note (Signed)
[facility] OFFICE NOTE   KIFFANY, SCHELLING                         MRN:          [id number]  DATE:02/09/2007                            DOB:          09/29/1913    Ms. Roehrich returns today for further management of her hypertension.  Please see my note from November 17, 2006, for her Problem List.   On her last visit, we started enalapril 5 mg p.o. b.i.d.  We  subsequently have increased that via telephone to 10 mg twice a day.  Her pressure is still running above 160 systolic.   She feels well otherwise.  She has been extremely anxious, and we  prescribed Xanax 0.25 mg which has helped significantly with her  resting.   Her current medications are unchanged except the enalapril is now at 10  mg p.o. b.i.d.   PHYSICAL EXAMINATION:  VITAL SIGNS:  Her pressure today is 168/95.  Her  pulse is 68 and regular.  Her weight is 141.  GENERAL:  She looks remarkably good.  HEENT:  Unchanged.  NECK:  Carotids are full.  No bruits.  Thyroid is not enlarged.  Trachea  is midline.  LUNGS:  Clear to auscultation.  HEART:  Reveals a nondisplaced PMI.  She has normal S1 and S2.  ABDOMEN:  Soft, good bowel sounds.  EXTREMITIES:  Reveal no edema.  Pulses are intact.  NEUROLOGIC:  Exam is intact.   ASSESSMENT:  Essential hypertension not under optimal control.  Goal  will be systolic of 140.   PLAN:  1. I have increased enalapril to 20 mg p.o. b.i.d.  If she continues      to run above 160 at her facility, we will add amlodipine 5 mg a      day.  2. I will see her back in 4 weeks.     Thomas C. Daleen Squibb, MD, [facility]  Electronically Signed    TCW/MedQ  DD: 02/09/2007  DT: 02/09/2007  Job #: [id number]

## 2010-10-21 NOTE — Assessment & Plan Note (Signed)
[facility] OFFICE NOTE   ALIYHA, FORNES                         MRN:          [id number]  DATE:03/02/2007                            DOB:          1914/02/20    Paw Karstens returns today for further management of her cardiovascular  issues. Most specifically, her hypertension.   We have recently added Amlodipine to 5 mg daily.   She takes:  1. Enalapril 20 mg b.i.d.  2. Lasix 40 mg daily.  3. K-Dur 20 mEq daily.  4. Carvedilol 9.375 mg b.i.d.   Her blood pressure has been checked on a regular basis and are running  now about 130 to 150 systolic, mostly around 135 to 140. She says that  she feels the best she has felt other than her arthritis in her knees.   She states that today that Dr. Renae Fickle has recommended knee surgery. I am  reluctant to clear her for this because of her stress induced myocardial  infarction, not to mention the prolonged rehab she had after her  infarction. She agrees with this.   I have made no changes in her program today. I have asked her to  continue with her current medical program. The [facility] staff can  check her blood pressure now once a week.   I am very pleased that Mrs. Dromgoole is doing well.   PHYSICAL EXAMINATION:  VITAL SIGNS:  Blood pressure 152/78, pulse 62 and  regular, weight 137 and down 4.  HEENT:  Unchanged.  NECK:  Carotids are full. There is no JVD. Thyroid is not enlarged.  Trachea is midline.  LUNGS: Clear.  HEART:  Regular rate and rhythm with no gallop.  ABDOMEN:  Soft.  EXTREMITIES:  1+ edema on the right, trace on the left. Pulses were  present.  NEUROLOGIC:  Intact.   I am delighted again how Mrs. Cowdery is doing. I have made no changes.  If she begins to have some more bruising of her hands and her arms, we  can cut her aspirin from 325 mg to 81 mg daily. I will see her back  again in six months.     Thomas C. Daleen Squibb, MD, [facility]  Electronically Signed    TCW/MedQ  DD: 03/02/2007  DT: 03/02/2007  Job #: [id number]

## 2010-10-21 NOTE — Assessment & Plan Note (Signed)
[facility] OFFICE NOTE   [name], [name]                         MRN:          [id number]  DATE:08/31/2007                            DOB:          08-24-1913    [name] returns today for close followup and management of following  issues.  1. History of stress induced acute anterior wall myocardial      infarction.  This is felt to be secondary to Tako-Tsubo syndrome.      She had full recover LV function and resolution of an apical      thrombus.  2. Post infarct atrial fib now in sinus rhythm.  3. Anticoagulation.  She is no longer on Coumadin but on aspirin 81 mg      a day.  She has had no recurrent symptoms of atrial fib.  4. Peri infarction pericarditis now resolved.  5. Hyperlipidemia.  6. Hypertension.   Her blood pressure has been running about 130-140 with the addition of  amlodipine.  She denies any orthopnea, PND peripheral edema.  She had no  chest pain, presyncope tachy palpitations.  She has had no  lightheadedness with standing.  She comes in on a little three-wheel  scooter.   Her meds are unchanged since the addition of amlodipine 5 mg a day from  her previous visit.   Her blood pressure was up today 160/66, her pulse is 60 and regular.  She is a bit emotional by losing a son about 4 weeks ago.  Her weight is  up about 8 pounds.  Her skin is warm and dry.  She her skin color is good.  HEENT:  Unchanged otherwise.  Carotids are full.  Thyroid is not enlarged.  Trachea is midline.  LUNGS:  Clear.  HEART:  Reveals a nondisplaced PMI.  She has normal S1-S2.  ABDOMEN:  Soft, good bowel sounds.  No midline bruit.  EXTREMITIES:  No cyanosis, clubbing, only trace edema.  Pulses are  intact.  NEURO:  Exam is intact.   EKG is sinus rhythm with LVH and strain.   IDENTIFICATION:  I think [name] is doing remarkably well.  I have  made no changes in medical program.  We will plan on  seeing her back in  3 months.     Thomas C. Daleen Squibb, MD, [facility]  Electronically Signed   TCW/MedQ  DD: 08/31/2007  DT: 08/31/2007  Job #: [id number]   cc:   Lenon Curt. Chilton Si, M.D.

## 2010-10-21 NOTE — Assessment & Plan Note (Signed)
[facility] OFFICE NOTE   KASHVI, PREVETTE                         MRN:          [id number]  DATE:07/24/2008                            DOB:          [birth year]    [name] comes in today for followup.  Other than her chronic  arthritic pain mostly in her knees and ankles, she has no complaints.   I saw her last December 07, 2007.  She was doing well at that time.   She denies any chest discomfort angina, orthopnea, PND, or tachy  palpitations.   CARDIAC MEDICATIONS:  1. Aspirin 325 mg a day.  2. Lasix 40 mg a day.  3. Potassium 20 mEq a day.  4. Norvasc 5 mg a day.  5. Enalapril 20 mg b.i.d.  6. Carvedilol 9.75 b.i.d.  7. Simvastatin 40 mg nightly.   Her blood work is followed by Dr. Murray Hodgkins.   Her active cardiovascular issues include:  1. History of a stress-induced acute anterior wall infarct with full      recovery.  2. Post infarct atrial fib, now in sinus rhythm.  3. Peri-infarction pericarditis, now resolved.  4. Hypertension.  5. Hyperlipidemia.   PHYSICAL EXAMINATION:  GENERAL:  She looks remarkably good today.  VITAL SIGNS:  Her blood pressure in the left arm with a large cuff was  145/70, pulse is 72 and regular.  Her weight is 136.  HEENT:  Unchanged and unremarkable.  NECK:  Carotid upstrokes were equal bilateral without bruits.  No JVD.  Thyroid is not enlarged.  Trachea is midline.  LUNGS:  Clear to auscultation and percussion.  HEART:  A nondisplaced PMI.  She has a soft systolic murmur.  No gallop.  ABDOMEN:  Soft.  Good bowel sounds.  No midline bruit.  No hepatomegaly.  EXTREMITIES:  No cyanosis, clubbing, or edema.  Pulses were present.  NEUROLOGIC:  Intact.   Electrocardiogram is essentially unremarkable except for some  nonspecific ST-segment changes laterally, which are stable.   ASSESSMENT AND PLAN:  [name] is doing very well from my standpoint.  I have made no  changes in her medical program.  We will see her back in  6 months or as needed.     Thomas C. Daleen Squibb, MD, [facility]  Electronically Signed    TCW/MedQ  DD: 07/24/2008  DT: 07/24/2008  Job #: [id number]

## 2010-10-21 NOTE — Assessment & Plan Note (Signed)
[name] OFFICE NOTE   [name], [name]                         MRN:          [id number]  DATE:11/17/2006                            DOB:          1913/12/10    Ms. [name] returns today for further management of the following  issues:  1. Nonobstructive coronary disease status post large anterior apical      septal infarct secondary to Takotsubo's. She has had full recovery      of her LV function by her last echo and resolution of an apical      thrombus.  2. Post infarct atrial fib now in sinus rhythm.  3. Anticoagulation. She is no longer on Coumadin but a full dose      aspirin 325 a day. I have been worried about falls and she has      remained in sinus rhythm and she really does not want to take the      drug.  4. Periinfarctional pericarditis now resolved.  5. Nonspecific viral gastroenteritis now resolved.  6. Hyperlipidemia.   She is doing well at [facility]. She is out of rehab.   CURRENT MEDICATIONS:  1. Aspirin 325 a day.  2. Multivitamin.  3. Os-Cal with vitamin D.  4. Enalapril 2.5 b.i.d.  5. Carvedilol 9.375 b.i.d.  6. Simvastatin 40 mg q.h.s.  7. Lasix 40 mg a day.  8. Potassium 20 mEq a day.  9. Nexium 40 mg a day.   She offers no complaints today. She is under a lot of stress with her  son being sick and in the hospital again.   PHYSICAL EXAMINATION:  VITAL SIGNS:  Her blood pressure is 118/58, her  pulse 64 and regular. She has an occasional extrasystole.  GENERAL:  She is in no acute distress.  SKIN:  Warm and dry. Skin color is normal.  HEENT:  No change.  NECK:  Carotids are full, no bruits. Thyroid is not enlarged, trachea is  midline. There is no JVD.  LUNGS:  Clear to auscultation.  HEART:  Reveals a soft S1, S2 without gallop, rub or murmur.  ABDOMEN:  Soft with good bowel sounds.  EXTREMITIES:  Reveal no edema. Pulses are present but reduced.  NEUROLOGIC:   Intact.   [name] is doing well. I have made no changes in her medical  program. Will have [facility] draw a comprehensive metabolic panel and  fasting lipids. I will see her back in 3 months.     Kathleen C. Daleen Squibb, MD, [facility]  Electronically Signed    TCW/MedQ  DD: 11/17/2006  DT: 11/17/2006  Job #: [id number]   cc:   [name], M.D.

## 2010-10-21 NOTE — Assessment & Plan Note (Signed)
[facility] OFFICE NOTE   [name], [name]                         MRN:          [id number]  DATE:12/07/2007                            DOB:          28-Jun-1913    [name] returns today.  Other than some chronic arthritic changes  and pain, she is doing well.  She has no specific cardiac complaints.  She does have some fatigue, but this is a chronic issue.   She is now running borderline blood sugars, and has been advised to  alter her diet.  Blood sugar checks from [facility] show 98-108, so it is  not too high.   MEDICATIONS:  Unchanged except she is on metformin 500 mg a day.  She  continues on aspirin 325 a day, Os-Cal, vitamin D, Nexium 40 mg a day,  Lasix 40 mg a day, potassium 20 mEq a day, amlodipine 5 mg a day,  enalapril 20 mg p.o. b.i.d., Carvedilol 9.75 b.i.d., and simvastatin 40  mg a day.   For her complete problem list, see my last note August 31, 2007.   PHYSICAL EXAMINATION:  VITAL SIGNS:  Blood pressure is 136/72, her pulse  is 76 and regular.  I listen to her a long time, and I feel she is in  sinus rhythm.  Her weight is 143 and stable.  HEENT:  Unchanged.  NECK:  Carotids upstrokes were equal bilaterally with soft systolic  sounds bilaterally.  Thyroid is not enlarged.  Trachea is midline.  LUNGS:  Clear.  HEART:  Regular rate and rhythm.  No gallop.  ABDOMEN:  Soft.  EXTREMITIES:  No edema.  Pulses are present.  NEURO:  Grossly intact.  She is a little bit hard in hearing but  otherwise is sharp as a briar.   ASSESSMENT/PLAN:  [name] is doing well.  Unfortunately, she is 77  years of age and is suffering from chronic pain and arthritis.  I have  told her she can take Tylenol Arthritis as many as 6 a day.  She will  continue to monitor her blood sugar, although I have told her that if  she wants some occasional sweets then to please enjoy some quality of  life.  She will  continue with her other medications.  Her blood pressure  is under excellent control and her rhythm seems to be sinus rhythm, and  she is on good secondary risk factor modification for future coronary  events, strokes, etc.  I will see her back in 6 months.     Thomas C. Daleen Squibb, MD, [facility]  Electronically Signed    TCW/MedQ  DD: 12/07/2007  DT: 12/08/2007  Job #: [id number]   cc:   Lenon Curt. Chilton Si, M.D.

## 2010-10-21 NOTE — Assessment & Plan Note (Signed)
[facility] OFFICE NOTE   [name], [name]                         MRN:          [id number]  DATE:01/05/2007                            DOB:          08/19/1913    [name] comes in today as an add-on because of hypertensive problem  yesterday.   Please refer to my previous notes for her problem list.   She ate a hot dog and a bag of potato chips yesterday.  Her blood  pressure was running about 180-190 on several checks.  Dr. Gala Romney was  called.  He increased her Enalapril from 2.5 b.i.d. to 5 mg p.o. b.i.d.  Blood pressure came back to 145-148.   She feels good today.  She denies any chest pain or shortness of breath.  When her pressure was elevated, she had a dull headache.   PHYSICAL EXAMINATION:  VITAL SIGNS:  Her pressure today is 158/80, pulse  72 and regular, weight is stable at 141.  GENERAL APPEARANCE:  She is alert and oriented.  HEENT:  Unchanged.  LUNGS:  Clear.  There is no JVD.  CARDIOVASCULAR:  Regular rate and rhythm without gallop.  ABDOMEN:  Soft.  EXTREMITIES:  Trace to 1+ edema.  Pulses are intact.   ASSESSMENT/PLAN:  1. Labile hypertension.  2. Salt load probably precipitating hypertensive episode.   PLAN:  Continue Lasix 40 mg a day, potassium 20 mEq a day, carvedilol  9.375 b.i.d. and the new dose of Enalapril 5 mg p.o. b.i.d.  I have told  her son, Al, who is with her today, that if her pressure starts to run  consistently above 160, to call us for further adjustment.  If it starts  to run low, i.e., less than 100 systolic, or she is symptomatic, also to  call for further adjustments.  I will plan on seeing her on her  scheduled visit September 2008.     Thomas C. Daleen Squibb, MD, [facility]  Electronically Signed    TCW/MedQ  DD: 01/05/2007  DT: 01/06/2007  Job #: [id number]

## 2010-10-24 NOTE — Assessment & Plan Note (Signed)
[facility] OFFICE NOTE   FAYETTE, GASNER                         MRN:          [id number]  DATE:09/20/2006                            DOB:          21-Apr-1914    Ms. [name] returns today for followup post hospitalization.   PROBLEM LIST:  1. Takotsubo's cardiomyopathy.  She had a large anterior apical and      septal infarct with nonobstructive disease in her coronaries.      Hence the diagnosis of Takotsubo's cardiomyopathy.  Her ejection      fraction was 25%.  The 2D echocardiogram showed an apical thrombus.      She has been on anticoagulation.  2. Atrial fibrillation which is now converted to normal sinus rhythm.  3. Anticoagulation.  Her prothrombin times have been drawn at [facility].  We are checking to see if these are being      monitored in our [facility].  4. Periinfarctional pericarditis now resolved.  5. Nonspecific viral gastroenteritis during her hospitalization.   She has done well since discharge.  She is ready to move back into her  apartment with just minimal assistance.  She does not like taking  Coumadin and she says that she is still very weak.   CURRENT MEDS:  1. Aspirin 325 a day.  2. Multivitamin daily.  3. Os-Cal/Vitamin D daily.  4. Enalapril 2.5 b.i.d.  5. Carvedilol 9.375 mg b.i.d.  6. Simvastatin 40 mg q.h.s.  7. Protonix 40 mg a day  8. Lasix 40 mg a day.  9. Potassium 20 mEq a day.  10.Coumadin as directed.   Her blood pressure today is 120/80, her pulse is 69 and regular.  EKG  shows sinus rhythm with diffuse ST segment T wave changes in almost  every lead.  She is in no acute distress.  She is slightly pale.  She  weighs 130 pounds.  HEENT:  Normocephalic, atraumatic, PERRLA.  Extraocular movements  intact.  Sclera clear.  Facial symmetry is normal.  Carotids are full,  there is no JVD, thyroid is not enlarged, trachea is midline.  LUNGS:  Clear to auscultation.  HEART:  No obvious apical dyskinesia.  There is no gallop.  She has a  soft systolic murmur along the left sternal border.  ABDOMINAL EXAM:  Soft with good bowel sounds.  There is no tenderness.  There is no hepatomegaly.  EXTREMITIES:  No edema.  Pulses are present.   ASSESSMENT AND PLAN:  Ms. Bergh is doing remarkably well.  She is  slowly on the mend.   Plan:  1. Continue current medical program.  2. Reinforce careful monitoring of her prothrombin time now that is      moving out of skilled nursing.  Will check in with our office in      [place].  She will need to be on Coumadin for at least 3 months      or until her thrombus has resolved from her LV.  3. Follow up with me  in 4 weeks.  4. Repeat echo 3 months after infarction.     Thomas C. Daleen Squibb, MD, [facility]  Electronically Signed    TCW/MedQ  DD: 09/20/2006  DT: 09/20/2006  Job #: [id number]   cc:   Lenon Curt. Chilton Si, M.D.

## 2010-10-24 NOTE — H&P (Signed)
NAMEDEBBI, [name]                ACCOUNT NO.:  [id number]   MEDICAL RECORD NO.:  [id number]          PATIENT TYPE:  INP   LOCATION:  2908                         FACILITY:  [facility]   PHYSICIAN:  Jesse Sans. Wall, MD, FACCDATE OF BIRTH:  10-11-1913   DATE OF ADMISSION:  08/25/2006  DATE OF DISCHARGE:                              HISTORY & PHYSICAL   REASON FOR ADMISSION:  She is being transferred emergently from [facility] for an acute MI.  Her [facility] Record  number is [id number].   CHIEF COMPLAINT:  Neck and upper back discomfort.   HISTORY OF PRESENT ILLNESS:  Mrs. Froman is a 77 year old white female  who presented yesterday with discomfort and elevated cardiac enzymes.  She was felt to have a non-Q wave infarction.   She has been on Coumadin for chronic atrial fibrillation.  Her INR was  supratherapeutic.  She was treated intravenous heparin, beta blockade,  aspirin.   This morning, about 7 a.m. she awoke with worsening pain in her neck and  back.  Her EKG now shows some ST segment elevation, about 1 mm, in the  lateral and anterolateral leads.  She is having pain at the present  time.   Her INR this morning is 2.7.  Her PTT was 150.  Her troponin level is  now up to 8.5.   PAST MEDICAL HISTORY:  1. History of congestive heart failure, unknown type.  2. History of osteoporosis.  3. History of gastroesophageal reflux disease.  4. She has severe problems with arthritis.  She is fairly limited with      ambulation, but is very sharp mentally per her son, Lateesha Bezold, who      I called this morning.   ALLERGIES:  SULFA.   MEDICATIONS:  1. Nexium 40 mg daily.  2. Multivitamin one tablet daily.  3. Lasix 40 mg b.i.d.  4. Os-Cal D one tablet t.i.d.  5. Aspirin 81 mg daily.   SOCIAL HISTORY:  She denies any smoking, does not drink.   FAMILY HISTORY:  Significant for hypertension and arthritis.   REVIEW OF SYSTEMS:  She is subject to  falls.  She lives at [facility]  She is very independent, otherwise.  HEENT:  Eyes:  No blurred vision.  ENT:  No difficulty swallowing.  No tinnitus.  RESPIRATORY:  No cough or  wheezes.  CARDIOVASCULAR:  No previous chest discomfort or shortness of  breath, palpitations or syncope.  GI:  No nausea, vomiting, diarrhea or  melena. GU:  No Hematuria or dysuria.  ENDOCRINE:  No polyuria.  No  nocturia.  No recent change in weight.  NEUROLOGICAL:  No numbness or  tingling.  Rest of her review of systems were reviewed and were  negative.   PHYSICAL EXAMINATION:  GENERAL:  She is in mild distress.  She is mildly  dyspneic.  VITAL SIGNS:  Respirations 20, saturations 95% on O2, blood pressure  130/80, heart rate 72 after intravenous Lopressor 5 mg x4.  She is in  sinus rhythm.  HEENT:  Normocephalic, atraumatic.  PERRLA.  Extraocular movements  intact.  Sclerae are clear.  Her teeth are intact.  No active issues.  NECK:  Supple.  No JVD.  Carotid upstrokes are equal bilaterally without  bruits.  Thyroid is not enlarged.  Trachea is midline.  LUNGS:  Remarkable for some crackles in the bases.  HEART:  Regular rate and rhythm with soft systolic murmur at the apex.  No gallops.  ABDOMEN:  Soft, good bowel sounds.  No tenderness.  No organomegaly.  EXTREMITIES:  Some ecchymoses in the upper extremities.  Lower  extremities show some varicosities.  There is trace to 1+ pitting edema.  Pulses are present.  Her femoral pulses are 2+/4+ without bruits.  NEUROLOGICAL:  Intact.  MUSCULOSKELETAL:  Chronic arthritic changes, particularly in the knees.   LABORATORY DATA:  Labs this morning show a glucose of 144, BUN 18,  creatinine 0.7.  Sodium 136, potassium 3.8.  Her last set of enzymes  show a CPK of 517, MB 21.7, troponin 8.5, and they are rising.  White  count is 11.6, hemoglobin 11.6, platelets 204, INR 2.3, PTT 150.   ASSESSMENT:  1. Acute coronary syndrome.  She is currently having ongoing  pain with      evidence of a potential ST segment elevation MI in the inferior      lateral leads.  She is having ongoing pains.  She is      hemodynamically stable.  2. Questionable history of atrial fibrillation on anticoagulation.  It      is interesting I see no documentation in the chart.  3. Potential urinary tract infection.  She was started on IV      antibiotics at the time of admission.  4. Status post history of falls.  5. Gastroesophageal reflux disease.  6. History of congestive heart failure, question unknown type.  7. Osteoporosis.   PLAN:  I have called her son, Ryleigh Esqueda.  I also have talked to her, as  well.  We have recommended transfer by [facility] to [facility]  for potential intervention.  I have discussed this also with Dr. Shawnie Pons, Interventional Cardiologist of my team.  He has instructed to  give her 5 mg of Vitamin K p.o., 300 mg of Plavix.  She has been given aspirin.  She is on IV nitroglycerin, and she is  receiving IV beta blockers.  He will assess her once she arrives at  [facility].  She may go to the lab urgently today.   Indications, risks, potential benefits have been discussed with the  patient and her son.  They agree to proceed.      Thomas C. Daleen Squibb, MD, [facility]  Electronically Signed     TCW/MEDQ  D:  08/25/2006  T:  08/25/2006  Job:  [id number]   cc:   [facility], [place], [place] [address] Bonner Puna

## 2010-10-24 NOTE — Discharge Summary (Signed)
[name], BENTLY                ACCOUNT NO.:  [id number]   MEDICAL RECORD NO.:  [id number]          PATIENT TYPE:  INP   LOCATION:  2040                         FACILITY:  [facility]   PHYSICIAN:  Flint Melter, MD      DATE OF BIRTH:  [birth year]   DATE OF ADMISSION:  08/25/2006  DATE OF DISCHARGE:                               DISCHARGE SUMMARY   ADDENDUM:  She has a follow-up appointment with Dr. Daleen Squibb at our  [facility] office April 7 at 11 a.m. as a new patient post  hospitalization visit, and I also need a carbon copy of her discharge  summary sent to Dr. Chilton Si at [facility].      Dorian Pod, ACNP    ______________________________  Flint Melter, MD    MB/MEDQ  D:  09/06/2006  T:  09/06/2006  Job:  [id number]   cc:   Lenon Curt. Chilton Si, M.D.

## 2010-10-24 NOTE — Discharge Summary (Signed)
[name], [name]                ACCOUNT NO.:  [id number]   MEDICAL RECORD NO.:  [id number]          PATIENT TYPE:  INP   LOCATION:  2040                         FACILITY:  [facility]   PHYSICIAN:  Dorian Pod, ACNP  DATE OF BIRTH:  1913/08/19   DATE OF ADMISSION:  08/25/2006  DATE OF DISCHARGE:  09/06/2006                               DISCHARGE SUMMARY   DISCHARGING PHYSICIAN:  Dr. Rex Kras.   PRIMARY CARDIOLOGIST:  Dr. Rex Kras.   PRIMARY CARE PHYSICIAN:  Dr. Chilton Si at [facility].   DISCHARGE DIAGNOSES:  1. Elevated cardiac enzymes in the setting of Takotsubo      cardiomyopathy.  Patient with a troponin of 8.67.  Status post 2D      echocardiogram this admission showing an ejection fraction greatly      depressed at 25%.  Akinesis of the mid-distal anterolateral wall,      akinesis of the mid-distal inferior wall, left ventricular wall      thickness moderately increased.  2. Acute on-chronic systolic congestive heart failure.  3. Apical thrombus along the antero-apical wall of the left ventricle      diagnosed by echocardiogram.  4. Atrial fibrillation.  5. Anticoagulation therapy with Coumadin.  6. Status post pericarditis.  7. Status post gastroenteritis.  8. De-conditioning.   PAST MEDICAL HISTORY:  Includes:  1. Congestive heart failure.  2. Osteoporosis.  3. GERD.  4. Arthritis.  5. History of falls.  6. Chronic atrial fib.  7. Remote history of pulmonary embolism.   PROCEDURES:  This admission, include a cardiac catheterization on August 25, 2006 by Dr. Bonnee Quin.  Patient found to have marked reduction of  left ventricular function with apical ballooning suggestive of Takotsubo  syndrome, with non-critical plaquing of coronary arteries.  A 2D  echocardiogram on August 25, 2006, reason which is stated above.   HOSPITAL COURSE:  Ms. [name] is a very pleasant 77 year old Caucasian  female, initially seen at [facility] for  complaints of back discomfort.  Patient had apparently fallen twice on  August 24, 2006.  She states she lost her balance and fell but did not  hit any part of her body.  During the nighttime, she fell again because  she has severe pain in her knees, with a history of arthritis.  She  denied any episodes of syncopal events.  She came to the emergency room  at [facility] from [facility] where she resides.  Her initial lab work at [facility] showed a troponin of 5.4.  Patient was  diagnosed with a non-Q-wave MI.  She denied any cardiac symptoms, no  chest pain, no shortness of breath, no palpitations.  Per documentations  from [place], EKG did not show any ischemic events.  Patient was  admitted, started on aspirin, nitroglycerin and beta blockers.  Echocardiogram was ordered.  We were asked to consult for further  evaluation.  Patient was also treated for a urinary tract infection with  IV antibiotics.  Patient was seen by Dr. Anola Gurney in consultation.  At that  time, her troponin was 8.5.  EKG then showed some ST segment elevation  about 1 mm in the lateral and anterolateral leads.  Patient was having  pain at that time.  Patient's INR was supra-therapeutic.  BUN and  creatinine 18 and 0.7.  Patient was felt to be hemodynamically stable;  however, having ongoing chest discomfort/acute coronary syndrome.  Patient was transferred to by [facility] to [facility] for  further evaluation.  She was treated with 5 mg of vitamin K, 300 mg of  Plavix.  Patient was taken to the cath lab on the 19th, results as  stated above.  The patient tolerated the procedure without complication.  Electrocardiogram obtained also showed apical thrombus.  Patient's chest  pain was felt to be most likely secondary to pericarditis.  Also,  cardiomyopathy most likely secondary to Takotsubo syndrome, as patient  was found to have non-obstructive CAD by cath.  They continued her IV   nitroglycerin, Heparin, resumed her Coumadin therapy, mildly diuresed  patient. Also continued Cipro for patient's urinary tract infection.  Patient continued to have complaints of chest discomfort.  Patient also  remained in atrial fib with rapid ventricular response at times.  She  was started on Cardizem and bolused for increased heart rate.  On the  22nd, she converted to sinus rhythm with occasional PACs.  Chest pain  resolved.  IV Diltiazem was discontinued, but continued her beta blocker  Ace inhibitor and Dig.  Patient experienced some mild volume overload,  diuresed with IV Lasix.  Patient continued to improve.  Cardiac rehab in  to work with patient, physical therapy also on board.  However, on the  25th, patient began vomiting, experienced diarrhea, felt very weak,  afebrile.  Stool checked for C. diff, lightly hydrated with improvement  in symptoms.  Patient treated prophylactically with Imodium.  Flu virus  titered negative.  C. diff results negative.  Patient had resolution of  GI symptoms by the 26th.  Continue to improve, getting stronger with  therapy assistance.  Foley cath discontinued.  Heparin discontinued on  the 28th.  Patient's INR therapeutic.  Case management, clinical social  worker on board.  Patient previously in assisted living, now will  require a skilled nursing facility at this time.  Dr. Daleen Squibb in to see  patient on day of discharge.  Patient asymptomatic, eager to return  home.  At time of discharge, patient afebrile, heart rate 64 and  regular, respirations 18, blood pressure 99/59.  Patient sat 99% on room  air, weight 134.5 pounds.  H&H 10.8, 31.8, platelets 333,000, sodium  136, potassium 4.6, BUN 19, creatinine 0.72.  At time of discharge, INR  1.8.  Patient is scheduled to have a PT-INR drawn on Wednesday, April  2nd.   MEDICATIONS:  At time of discharge:  1. Aspirin 325 mg daily.  2. Multivitamin daily.  3. Os-Cal/Vitamin D daily.  4. Enalapril  2.5 mg p.o. b.i.d. 5. Carvedilol 9.375 mg p.o. b.i.d.  6. Simvastatin 40 mg at bedtime.  7. Protonix 40 mg daily.  8. Nexium.  9. I suspect patient previously was on Lasix 40 mg daily.  10.Potassium 20 mEq daily.  11.Coumadin or Warfarin 5 mg daily, starting today, until patient has      INR checked on Wednesday.  12.Lexapro 5 mg daily or as previously prescribed.   Patient needs to follow up with Dr. Daleen Squibb at the [facility] office next  week.  Follow up with Dr. Chilton Si at  [facility] within the next  two weeks.  I have faxed patient's PT-INR, Coumadin doses to Dr. Thomasene Lot  office, along with a copy of patient's discharge instructions.   Duration of discharge encounter is 65 minutes.      Dorian Pod, ACNP     MB/MEDQ  D:  09/06/2006  T:  09/06/2006  Job:  [id number]   cc:   Lenon Curt. Chilton Si, M.D.

## 2010-10-24 NOTE — Assessment & Plan Note (Signed)
[facility]                                 ON-CALL NOTE   SALISHA, BARDSLEY                         MRN:          [id number]  DATE:01/17/2007                            DOB:          Mar 29, 1914    PRIMARY CARDIOLOGIST:  Dr. Daleen Squibb.   SUPERVISING CARDIOLOGIST:  Dr. Dietrich Pates.   SUMMARY:  On January 17, 2007 I received a page at approximately 6:30  from June Grady with [facility]. The phone number provided was [phone].  I returned the call within 5 minutes, however this was her son's house,  Data processing manager. He stated that I needed to call [facility] because his  mother was having difficulties with hypertension, and that the nurse had  called him, and he had asked her to call our answering service. It is  not clear as to why I was paged with her son's phone number. The correct  phone number that he provided me was [phone].  After hanging up with  him, I called this number and I spoke with June Grady, a nurse with [facility]. Apparently the RA had checked Ms. Cochrane blood pressure early  this morning when she woke up and to use the restroom and it was  approximately 190 systolically. The nurse rechecked it and obtained a  blood pressure of 200/90. She called as to what to do.   On questioning Ms. Mosetta Putt, RN she stated that the patient was not having  any neurological deficits or signs of heart failure. She was slightly  stuffy from a nasal standpoint and she had just gone to the bathroom  without difficultly. I suggested that she give her morning medications  at this time, recheck her blood pressure within a couple hours. I would  notify our [place] office so that they may pull her chart and review  her medications for continued adjustments. I also advised in the interim  if she should develop any neurological defects or signs of heart  failure, she should proceed to the nearest emergency room. I asked her  the range of her blood pressure ,what has it  been in the last several  days, and she states that it is usually running between 170 and 190  systolically.   When the [facility] office opens at 8 o'clock I will contact them to  inform them of Ms. Lalanne continued difficulties with hypertension.      Joellyn Rued, PA-C  Electronically Signed      Gerrit Friends. Dietrich Pates, MD, [facility]  Electronically Signed   EW/MedQ  DD: 01/17/2007  DT: 01/17/2007  Job #: [id number]

## 2010-10-24 NOTE — Cardiovascular Report (Signed)
NAMEMECHELE, KITTLESON                ACCOUNT NO.:  [id number]   MEDICAL RECORD NO.:  [id number]          PATIENT TYPE:  INP   LOCATION:  2908                         FACILITY:  [facility]   PHYSICIAN:  Arturo Morton. Riley Kill, MD, FACCDATE OF BIRTH:  1913/07/11   DATE OF PROCEDURE:  08/25/2006  DATE OF DISCHARGE:                            CARDIAC CATHETERIZATION   INDICATIONS:  Mrs. Touch is a 77 year old who presented to [facility].  She had fallen and fallen again.  She was noted to have  elevated enzymes and follow-up EKGs that suggested an acute  inferolateral event.  Because of this she was seen in consultation by  Dr. Daleen Squibb who transferred her promptly for possible cardiac  catheterization.  Importantly, the patient is on long-term Coumadin  because of a history of pulmonary embolus.  She received 5 mg of oral  vitamin K.  Her INR had fallen over the past couple of days and was at  2.3 earlier today before the vitamin K.  We explained the procedure to  the patient, and also then to the son over the phone and the reasons for  this.  She is brought promptly to the laboratory.   PROCEDURE:  1. Left heart catheterization  2. Selective coronary arteriography.  3. Selective left ventriculography.  4. Femoral closure of the right femoral artery using an Angio-Seal      device.   DESCRIPTION OF PROCEDURE:  The patient was brought to the  catheterization laboratory and prepped and draped in usual fashion.  Through an anterior puncture the right femoral artery was easily entered  on the first stick with an anterior puncture.  The 6-French sheath was  easily placed.  There is no significant bleeding noted.  Following this,  we took views of the left coronary artery.  They were hypertrophied and  large in caliber.  Critical stenosis was not noted.  Following this, RCA  angiography was performed.  Ventriculography was performed in the RAO  projection to ensure that we got the best views  of the left coronary  artery.  We then elected to perform repeat views using a 6-French guide  for better opacification.  With this, the catheter was then subsequently  removed.  The groin was reprepped.  Additional gloves were placed.  Using sterile technique, a 6-French Angio-Seal device was implanted  after femoral angiogram was performed.  There was good hemostasis with  this.  She was taken back to the holding area in satisfactory clinical  condition.  There were no complications.   HEMODYNAMIC DATA:  1. Central aortic pressure 130/59, mean 89.  2. Left ventricular pressure 139/15.  3. No gradient on pullback across aortic valve.   ANGIOGRAPHIC DATA:  1. Ventriculography was done in the RAO projection.  Overall estimated      ejection fraction would be in the range of 30-35%.  There is      hyperdynamic motion of the inferior and anterolateral base.  There      was a large area of apical ballooning with the apical and anterior  akinesis.  This also involved the distal inferior wall.  2. The left main coronary artery is large and free of critical      disease.  3. The left anterior descending artery is calcified.  It is a large-      caliber vessel with a major diagonal.  The diagonal itself has      probably 30-50% ostial narrowing but it does not appear to be      tight.  The vessel itself is very large in caliber.  Distal to the      takeoff of the diagonal, the vessel also has luminal irregularities      with a couple of segmental areas of about 30-40%.  The apical      vessel wraps the apex.  There is not critical narrowing noted.  4. The circumflex system provides predominantly smaller first marginal      and then has about 30% eccentric narrowing.  There is a large      second marginal system.  None of these have critical disease.  5. The right coronary artery provides a bifurcating posterior      descending and posterolateral system.  There is segmental       irregularity of about 30% with calcification in the proximal mid      vessel and then another area of about 30% in the mid vessel and      this appears to be critical.   CONCLUSION:  1. Marked reduction in left ventricular function with apical      ballooning suggestive of takotsubo.  2. Noncritical plaquing of the coronary arteries as described above.   DISPOSITION:  The patient apparently does have a prior history of  pulmonary embolus.  We will need to get the patient back on her  Coumadin.  She will be treated with aspirin.  Plavix will be held at the  present time.      Arturo Morton. Riley Kill, MD, [facility]  Electronically Signed     TDS/MEDQ  D:  08/25/2006  T:  08/26/2006  Job:  [id number]   cc:   Jesse Sans. Wall, MD, [facility]

## 2010-10-24 NOTE — Assessment & Plan Note (Signed)
[facility] OFFICE NOTE   CHERRISE, OCCHIPINTI                         MRN:          [id number]  DATE:10/12/2006                            DOB:          03-04-1914    Ms. Jamison returns today for followup.  Please see the problem list  from September 20, 2006.   She is totally asymptomatic.  She has had no clinical symptoms of atrial  fibrillation or angina.  She is very compliant.  Her INRs are being  followed very closely with the last being therapeutic with followup  schedule of Oct 14, 2006.   Her medications are unchanged since last visit.  Please refer to the  maintenance medication list and also my note from September 20, 2006.   PHYSICAL EXAMINATION:  VITAL SIGNS:  On today's exam, her blood pressure  is 142/78, pulse 72 and regular, weight 138.  GENERAL:  She looks remarkably stronger.  HEENT:  Normocephalic, atraumatic.  PERRLA.  Extraocular movements  intact.  Skin color is normal.  Facial symmetry is normal.  NECK:  Supple.  Carotid upstrokes are equal bilaterally without bruits.  There is no JVD.  Thyroid not enlarged.  Trachea is midline.  LUNGS:  Clear.  HEART:  Nondisplaced PMI, normal S1, S2.  ABDOMEN:  Soft, good bowel sounds.  No midline bruit.  There is no  hepatomegaly.  EXTREMITIES:  No clubbing, cyanosis or edema with only trace edema.   LABORATORY DATA AND X-RAY FINDINGS:  CBC showed a hemoglobin of 10.4.  Basic metabolic panel was normal.  Her INR was therapeutic.   ASSESSMENT/PLAN:  I am delighted with how Ms. Demartini is doing.  I will  schedule her for a 2-D echocardiogram in mid June.  If this shows  recovery of her left ventricular function, resolution of her apical  thrombus and she is having no atrial fibrillation, will discontinue her  Coumadin.  I will see her shortly thereafter the echocardiogram.     Jesse Sans. Daleen Squibb, MD, [facility]  Electronically Signed    TCW/MedQ  DD: 10/12/2006   DT: 10/12/2006  Job #: [id number]   cc:   Shona Simpson [facility]  Lenon Curt. Chilton Si, M.D.

## 2010-10-28 NOTE — Discharge Summary (Signed)
NAMENIREL, BABLER                ACCOUNT NO.:  [id number]  MEDICAL RECORD NO.:  [id number]           PATIENT TYPE:  I  LOCATION:  2917                         FACILITY:  [facility]  PHYSICIAN:  Duke Salvia, MD, FACCDATE OF BIRTH:  06-04-1914  DATE OF ADMISSION:  09/26/2010 DATE OF DISCHARGE:  10/01/2010                        DISCHARGE SUMMARY - REFERRING   PRIMARY CARE PHYSICIAN:  Larina Earthly, MD  PRIMARY CARDIOLOGIST:  Antonieta Iba, MD  ELECTROPHYSIOLOGIST:  Duke Salvia, MD, [facility]  PRIMARY DIAGNOSIS:  Syncope with intermittent complete heart block.  SECONDARY DIAGNOSES: 1. Stress-induced acute anterior wall and partial recovery (takotsubo     cardiomyopathy). 2. History of pulmonary embolism. 3. Hypertension. 4. Hyperlipidemia. 5. Non-insulin dependent diabetes. 6. Osteoarthritis. 7. Postinfarct atrial fibrillation. 8. Recurrent urinary tract infections.  ALLERGIES:  The patient has no known drug allergies.  PROCEDURES THIS ADMISSION: 1. Chest x-ray on September 26, 2010, demonstrated cardiomegaly without     heart failure. 2. Echocardiogram on September 28, 2010, that demonstrated an ejection     fraction of 60% to 65% with no regional wall motion abnormalities.     The patient had trivial aortic valve regurgitation and her left     atrium was mildly dilated. 3. Implantation of a dual-chamber pacemaker by Dr. Graciela Husbands on September 29, 2010.  The patient received a [employer] model number [id number]     pacemaker with model number 1944 right atrial lead and model number     1948 right ventricular lead.  The patient had no early apparent     complications. 4. Chest x-ray on September 30, 2010, demonstrated no pneumothorax status     post pacemaker implant.  BRIEF HISTORY OF PRESENT ILLNESS:  Ms. Hitch is a 77 year old female with known history of takotsubo cardiomyopathy with subsequent total recovery and normal ejection fraction on followup.  The patient also has a  history of pulmonary emboli, hypertension, hyperlipidemia, non-insulin dependent diabetes mellitus, postinfarct atrial fibrillation.  She was in her usual state of health until earlier in the week prior to admission when she was walking back to her room after having dinner at assisted living facility and she had a syncopal spell that came on without warning.  She had recurrent syncopal spells and underwent evaluation by her primary care physician including a head CT, which was normal. Because of her recurrent syncopal spells, she presented to [facility] for further evaluation.  HOSPITAL COURSE:  The patient was admitted and placed on telemetry. Echocardiogram was obtained, which demonstrated normal ejection fraction.  Telemetry demonstrated intermittent complete heart block. The patient's intermittent heart block persisted despite discontinuation of her carvedilol.  Because of these things, she was evaluated by Dr. Graciela Husbands with Electrophysiology who recommended pacemaker implantation.  Risks, benefits, and alternatives of procedure were explained with the patient and she wished to proceed.  The patient underwent pacemaker implantation on September 30, 2010.  She continued to be monitored on telemetry.  She was monitored for an extra day because of underlying device dependence.  The patient's left chest was without hematoma or ecchymosis.  Telemetry demonstrated AV pacing.  Dr. Graciela Husbands examined the patient on October 02, 2010, and considered her stable for discharge.  FOLLOWUP APPOINTMENTS: 1. [facility] in the [place] office on Oct 08, 2010, at 12 noon. 2. Dr. Graciela Husbands in the [facility] office in July - the office     will call to schedule that appointment.  DISCHARGE INSTRUCTIONS: 1. Increase activity slowly. 2. No driving for 1 week. 3. Follow low-sodium heart-healthy diet. 4. See discharge instructions for wound care or mobility. 5. Keep incision  clean and dry for 1 week.  DISCHARGE MEDICATIONS: 1. Aspirin 81 mg 2 tablets daily. 2. Azopt 1 drop in each eye daily at bedtime. 3. Beta-carotene with minerals 1 tablet twice daily. 4. Calcium plus vitamin D 1 tablet every morning. 5. Carvedilol 6.25 mg 1-1/2 tablet twice daily. 6. Enalapril 20 mg twice daily. 7. Estrace vaginal cream one application vaginally 3 times weekly at     bedtime. 8. Lasix 40 mg daily. 9. Metformin 500 mg daily. 10.Multivitamin daily. 11.Nexium 40 mg daily. 12.Nitrofurantoin 50 mg daily. 13.Norvasc 5 mg daily. 14.Potassium 20 mEq daily. 15.Premarin cream one application vaginally every Monday, Wednesday,     and Friday. 16.Simvastatin 20 mg daily. 17.Tramadol 50 mg 1 tablet every 8 hours as needed. 18.Tylenol 500 mg 1-2 tablets every 6 hours as needed for knee pain. 19.Voltaren 1% gel one application topically daily as needed for pain. 20.Xalatan eye drops 1 drop each eye daily at bedtime. 21.Xanax 0.25 mg one-half to one tablet every 8 hours as needed for     anxiety.  DISPOSITION:  The patient was seen and examined by Dr. Graciela Husbands on October 01, 2010, and considered stable for discharge to rehab.  DURATION OF DISCHARGE ENCOUNTER:  35 minutes.     Gypsy Balsam, RN,BSN   ______________________________ Duke Salvia, MD, [facility]    AS/MEDQ  D:  10/01/2010  T:  10/01/2010  Job:  [id number]  cc:   Larina Earthly, M.D. Antonieta Iba, MD  Electronically Signed by Gypsy Balsam RNBSN on 10/02/2010 02:40:13 PM Electronically Signed by Sherryl Manges MD [facility] on 10/28/2010 04:23:33 PM

## 2010-10-29 NOTE — Consult Note (Signed)
NAMEREYES, FIFIELD                ACCOUNT NO.:  [id number]  MEDICAL RECORD NO.:  [id number]           PATIENT TYPE:  I  LOCATION:  3733                         FACILITY:  [facility]  PHYSICIAN:  Larina Earthly, M.D.        DATE OF BIRTH:  1914-04-15  DATE OF CONSULTATION:  09/26/2010 DATE OF DISCHARGE:                                CONSULTATION   REASON FOR CONSULTATION:  Internal medicine consultation for medical problems from Dr. Jens Som of [facility].  CHIEF COMPLAINT:  Syncope.  HISTORY OF PRESENT ILLNESS:  I was actually called to admit this patient for syncope and dizziness by a patient who is actively followed by [facility], Dr. Mariah Milling in [place].  Given high probability of a cardiovascular etiology, the patient was subsequently admitted by Dr. Jens Som and his colleagues.  History reviewed and consists of two different episodes of syncope in the setting of the past medical history significant for hypertension, diabetes, hyperlipidemia, takotsubo MI from 2008.  The patient presents with a history of syncope approximately 1 week ago sudden onset and apparently not related to palpitations or position.  This was reported to [facility] staff; however, the patient was managed conservatively given the fact that she did not have any shortness of breath, nausea, or chest pain and the duration was quite brief approximately 2 seconds.  She did suffer a fall as a result striking her left side of her head, left knee, and left torso resulting in significant bruising.  She had a second episode 2 days later after a bowel movement with the above-mentioned trauma.  She did subsequently see my nurse practitioner, who found her to be hemodynamically stable and her EKG stable with a right bundle-branch block.  CT of the head was unremarkable for bleed, and plain films of the left upper extremity and left lower extremity were unremarkable for any evidence of  fracture. Given the possibility of a cardiovascular etiology for her syncope, consultation with her cardiologist, Dr. Lewie Loron was requested.  This was set up for later this week.  Today, she had recurrent dizziness of approximately 15 minutes while sitting without frank syncope but presyncope.  She was subsequently transported to the emergency room for further evaluation and management.  In the emergency room, she was hemodynamically stable.  Laboratory evaluation was unremarkable as below.  Cardiac enzymes were unremarkable.  Urinalysis was questionably positive but complicated by multiple epithelial cells.  Blood sugar was fine.  EKG revealed first-degree AV block and continued right bundle- branch block.  Given her cardiovascular risk factors and her history of nonobstructive coronary artery disease complicated by MI related to stress, there was a significant concern for etiologies such as bradycardia or arrhythmia given her sudden onset syncope versus orthostasis.  The patient was admitted by Cardiology.  REVIEW OF SYSTEMS:  Negative for fevers, chills, new visual complaints, earache, sinus congestion, difficulty swallowing, shortness of breath, chest pain; however, now the patient states that she may have had some palpitations during her episode of dizziness earlier today.  Negative for nausea, vomiting, change in bowel habits, but positive for significant  and chronic degenerative joint disease associated pain, especially in the knees and now worsened by recent fall and contusion and bruising without fracture.  The patient has been using topical Voltaren gel and has been prescribed Ultram; however, she has not been asking for this at [facility].  She has been using some Aleve unbeknownst to me.  The patient denies any focal neurological deficits.  PROBLEM LIST: 1. Coronary artery disease, thought to be stress related with     nonobstructive coronary artery disease  associated with MI in 2008,     followed by [facility], most recently Dr. Mariah Milling with     [facility] in [place] thought to be related to takotsubo     coronary artery disease with her event in 2008 complicated by     pericarditis and questionable atrial fibrillation.  There is     mention of pulmonary embolism by [facility]; however, I do     not have these records. 2. Nephrolithiasis.  Followed by Dr. Aldean Ast. 3. Type 2 diabetes diagnosed in 2009. 4. Diffuse arthritis, especially in the lower extremities. 5. Hypertension. 6. Hearing impairment requiring hearing aids. 7. Gastroesophageal reflux disease. 8. Osteopenia. 9. Mild dementia. 10.Glaucoma followed by Dr. Hazle Quant.  PAST SURGICAL HISTORY:  Secondary related to a kidney stone in 2004.  FAMILY HISTORY:  Significant for coronary artery disease, stroke, arthritis, hypertension, heart disease with MI, but not premature.  SOCIAL HISTORY:  The patient is widowed in 1996.  Resides at [facility].  Retired Catering manager.  Nonsmoker, nondrinker.  CURRENT LABORATORY DATA:  White blood cell count 9.0, hemoglobin 12.1, hematocrit 36.3%, platelet count 205.  CBG 103.  Sodium 140, potassium 4.0, BUN 22, creatinine 0.68, glucose 112, serum CO2 of 25.  Cardiac enzymes x1 negative.  Urinalysis positive for large amount of leukocytes, 21-50 white blood cells, few epithelial cells, and many bacteria, nitrite negative.  TSH is pending.  EKG reveals first-degree AV block and right bundle-branch block.  CURRENT MEDICATIONS: 1. Aspirin half of a 325 mg tablet each day. 2. Metformin 500 mg p.o. q.a.m. 3. Multivitamin 1 p.o. daily. 4. Os-Cal with vitamin D 1 p.o. q.a.m. 5. Nexium 40 mg daily. 6. Lasix 40 mg daily. 7. Potassium chloride 20 mEq daily. 8. Norvasc 5 mg daily. 9. Enalapril 20 mg p.o. b.i.d. 10.Carvedilol 9.75 mg twice daily. 11.Simvastatin 20 mg daily. 12.Eye vitamins. 13.Nitrofurantoin 50 mg  daily. 14.Tramadol 50 mg q.8 h. P.r.n. 15.Azopt 1 drop to each eye at bedtime. 16.Xalatan eyedrops 1 drop to each eye at bedtime. 17.Estrace 0.1 mg vaginal cream three times a week at bedtime. 18.Premarin cream  according to directions. 19.Diclofenac 1% gel as needed to painful areas. 20.Tylenol Arthritis 1-2 tablets as needed for knee pain. 21.Xanax 0.25 mg p.r.n. for blood pressure and anxiety.  PHYSICAL EXAMINATION:  GENERAL:  We have a pleasant Caucasian female lying flat in bed, alert and oriented x3, but somewhat hard-of-hearing. VITAL SIGNS:  Temperature 97.8 degrees Fahrenheit, pulse 60, respirations 18, oxygen saturation 96% on room air, blood pressure 129/60. HEENT:  Sclerae anicteric.  Extraocular movements are intact.  Face is symmetric; however, there are significant bruising still on left forehead, left knee, and left hip.  Tongue is midline. NECK:  Supple.  There is no cervical lymphadenopathy. LUNGS:  Clear to auscultation bilaterally. CARDIOVASCULAR:  Regular rate and rhythm, somewhat distant.  There is no axillary lymphadenopathy. ABDOMEN:  Soft, nontender, nondistended abdomen.  Bowel sounds are present. EXTREMITIES:  No  edema.  Pedal pulses are intact.  There is no evidence of cyanosis or edema.  Osteoarthritic changes are present on bilateral knees and hands with contusion and bruising to the lateral aspect of the left knee. NEUROLOGIC:  Cranial nerves II through XII grossly intact.  Light touch intact.  The patient's muscle strength exam is grossly intact in all four extremities.  ASSESSMENT AND PLAN: 1. Syncope.  Workup per Cardiology but certainly worrisome for     arrhythmia versus bradycardia, doubt orthostasis given her current     symptomatology and blood pressure readings.  Doubt seizure disorder     given any postictal confusion and seizure-like activity.     Electrolytes are normal.  TSH is pending.  Echocardiogram is     pending and workup per  Cardiology is pending. 2. History of recurrent urinary tract infections with question of a     urinary tract infection at this time.  Currently on Macrodantin     prophylactically.  The patient is asymptomatic with no dysuria and     no increase in frequency.  I did discuss with the patient possibly     obtaining to cath UA to ascertain whether or not she does have a     urinary tract infection and she is agreeable to this. 3. Degenerative joint disease complicated by recent fall.  Continue     home medications and encouraged Ultram use.  We would defer any     nonsteroidal anti-inflammatory medications given the possibility     for renal toxicity.  This was discussed with the patient and     family. 4. Type 2 diabetes.  Currently on sliding scale insulin. 5. Disposition.  If etiology does not turn out to be cardiovascular in     nature and the patient needs more prolonged hospitalization for     other additional workup, I will be happy to take of her care.     Larina Earthly, M.D.     RA/MEDQ  D:  09/26/2010  T:  09/27/2010  Job:  [id number]  Electronically Signed by Larina Earthly M.D. on 10/29/2010 06:02:02 PM

## 2010-11-21 ENCOUNTER — Encounter: Payer: Self-pay | Admitting: Internal Medicine

## 2011-01-21 ENCOUNTER — Encounter: Payer: Medicare Other | Admitting: Internal Medicine

## 2011-09-11 ENCOUNTER — Telehealth: Payer: Self-pay | Admitting: Internal Medicine

## 2011-09-11 NOTE — Telephone Encounter (Signed)
09-11-11 Pt moved to [place]/mt

## 2011-11-25 IMAGING — CR DG CHEST 1V PORT
1 series · 1 of 1 positions shown · non-contrast
Comparison: 08/29/2006.

CLINICAL DATA: Syncope.  Fall.

PORTABLE CHEST - 1 VIEW

[AP]
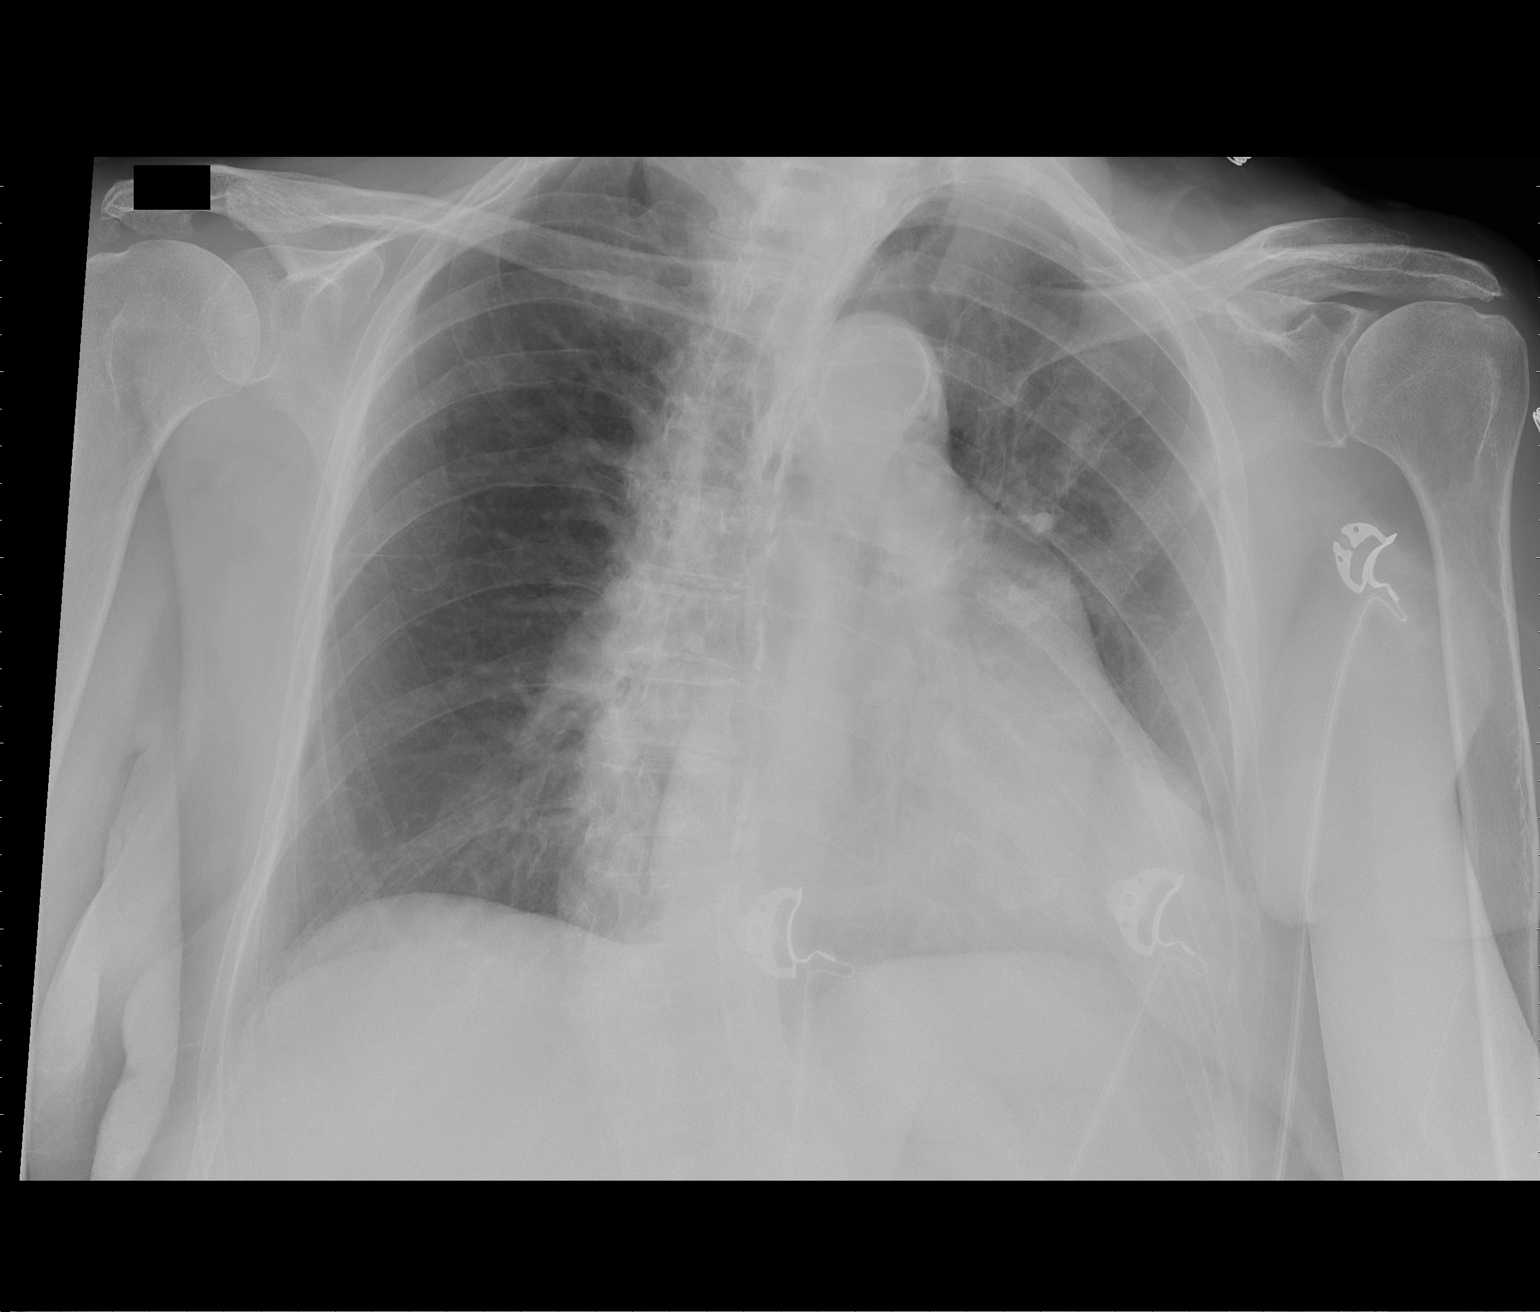

[1 of 1 positions shown; findings below may reference images not displayed]

FINDINGS: The patient is rotated to the left.  Cardiomegaly is
present.  Probable pulmonary arterial hypertension.  There is no
airspace disease.  No pneumothorax.  No displaced rib fractures are
identified.  Small ossification is present adjacent to the right
acromion. Monitoring leads are projected over the chest.
Retrocardiac density seen on prior exam appears improved. Calcified
aortic arch.
IMPRESSION: Cardiomegaly without failure.

## 2011-11-29 IMAGING — CR DG CHEST 1V PORT
1 series · 1 of 1 positions shown · non-contrast
Comparison: Chest radiograph 09/26/2010

CLINICAL DATA: Chest pain

PORTABLE CHEST - 1 VIEW

[AP]
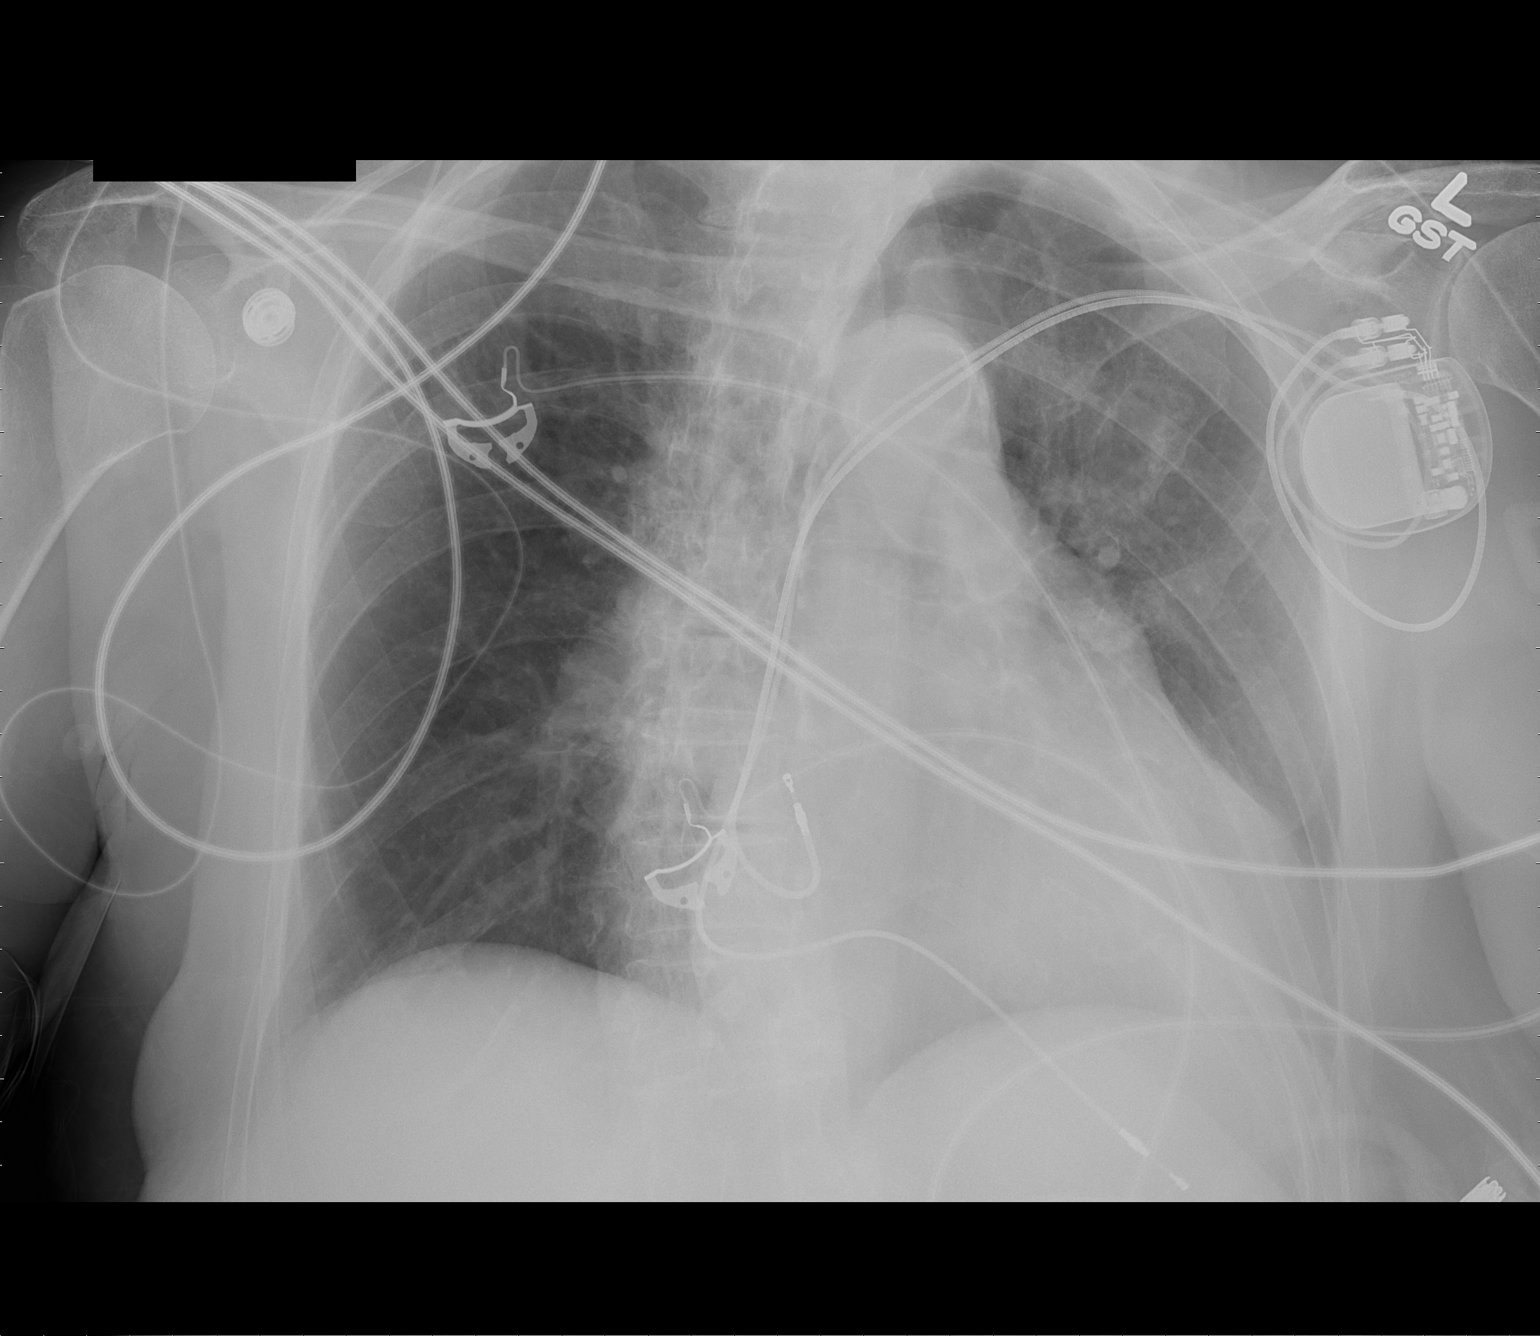

[1 of 1 positions shown; findings below may reference images not displayed]

FINDINGS: Stable enlarged heart silhouette.  There is left basilar
atelectasis similar to prior.  Right lung is clear.  No
pneumothorax.  Left pacemaker is new in the interval.  Patient
rotated leftward.  No pneumothorax.
IMPRESSION: 1.  Interval placement of left pacemaker without complication.
2.  Left basilar atelectasis and cardiomegaly.

## 2012-01-01 ENCOUNTER — Other Ambulatory Visit: Payer: Self-pay | Admitting: Internal Medicine

## 2012-01-18 ENCOUNTER — Other Ambulatory Visit: Payer: Self-pay | Admitting: Internal Medicine

## 2013-12-15 NOTE — Telephone Encounter (Signed)
Close encounter 

## 2014-06-08 DEATH — deceased
